# Patient Record
Sex: Male | Born: 1970
Health system: Southern US, Community
[De-identification: ages and names within clinical notes are randomized; demographics above are authoritative.]

## PROBLEM LIST (undated history)

## (undated) DIAGNOSIS — M109 Gout, unspecified: Secondary | ICD-10-CM

## (undated) DIAGNOSIS — I10 Essential (primary) hypertension: Secondary | ICD-10-CM

## (undated) DIAGNOSIS — I4891 Unspecified atrial fibrillation: Secondary | ICD-10-CM

## (undated) DIAGNOSIS — J45909 Unspecified asthma, uncomplicated: Secondary | ICD-10-CM

---

## 2008-09-05 ENCOUNTER — Emergency Department (HOSPITAL_COMMUNITY): Admission: EM | Admit: 2008-09-05 | Discharge: 2008-09-06 | Payer: Self-pay | Admitting: Emergency Medicine

## 2010-09-14 DIAGNOSIS — R0602 Shortness of breath: Secondary | ICD-10-CM

## 2011-02-21 ENCOUNTER — Inpatient Hospital Stay (INDEPENDENT_AMBULATORY_CARE_PROVIDER_SITE_OTHER)
Admission: RE | Admit: 2011-02-21 | Discharge: 2011-02-21 | Disposition: A | Discharge status: Home / Self Care | Payer: BC Managed Care – PPO | Source: Ambulatory Visit | Attending: Family Medicine | Admitting: Family Medicine

## 2011-02-21 ENCOUNTER — Ambulatory Visit (INDEPENDENT_AMBULATORY_CARE_PROVIDER_SITE_OTHER): Payer: BC Managed Care – PPO

## 2011-02-21 DIAGNOSIS — M79609 Pain in unspecified limb: Secondary | ICD-10-CM

## 2011-02-21 LAB — URIC ACID: Uric Acid, Serum: 11.6 mg/dL — ABNORMAL HIGH (ref 4.0–7.8)

## 2011-09-27 ENCOUNTER — Encounter (HOSPITAL_COMMUNITY): Payer: Self-pay | Admitting: *Deleted

## 2011-09-27 ENCOUNTER — Emergency Department (HOSPITAL_COMMUNITY): Payer: 59

## 2011-09-27 ENCOUNTER — Emergency Department (HOSPITAL_COMMUNITY)
Admission: EM | Admit: 2011-09-27 | Discharge: 2011-09-27 | Disposition: A | Discharge status: Home / Self Care | Payer: 59 | Attending: Emergency Medicine | Admitting: Emergency Medicine

## 2011-09-27 ENCOUNTER — Encounter (HOSPITAL_COMMUNITY): Payer: Self-pay | Admitting: Emergency Medicine

## 2011-09-27 ENCOUNTER — Emergency Department (HOSPITAL_COMMUNITY)
Admission: EM | Admit: 2011-09-27 | Discharge: 2011-09-27 | Disposition: A | Discharge status: Nursing Home (Includes ICF) | Payer: Managed Care, Other (non HMO) | Source: Home / Self Care | Attending: Emergency Medicine | Admitting: Emergency Medicine

## 2011-09-27 DIAGNOSIS — H11429 Conjunctival edema, unspecified eye: Secondary | ICD-10-CM | POA: Insufficient documentation

## 2011-09-27 DIAGNOSIS — H5710 Ocular pain, unspecified eye: Secondary | ICD-10-CM | POA: Insufficient documentation

## 2011-09-27 DIAGNOSIS — H00039 Abscess of eyelid unspecified eye, unspecified eyelid: Secondary | ICD-10-CM | POA: Insufficient documentation

## 2011-09-27 DIAGNOSIS — L03213 Periorbital cellulitis: Secondary | ICD-10-CM

## 2011-09-27 DIAGNOSIS — H05019 Cellulitis of unspecified orbit: Secondary | ICD-10-CM

## 2011-09-27 HISTORY — DX: Essential (primary) hypertension: I10

## 2011-09-27 LAB — DIFFERENTIAL
Basophils Absolute: 0.1 K/uL (ref 0.0–0.1)
Basophils Relative: 1 % (ref 0–1)
Eosinophils Absolute: 0.7 10*3/uL (ref 0.0–0.7)
Eosinophils Relative: 7 % — ABNORMAL HIGH (ref 0–5)
Lymphocytes Relative: 19 % (ref 12–46)
Lymphs Abs: 2 10*3/uL (ref 0.7–4.0)
Monocytes Absolute: 0.9 10*3/uL (ref 0.1–1.0)
Monocytes Relative: 9 % (ref 3–12)
Neutro Abs: 6.5 K/uL (ref 1.7–7.7)
Neutrophils Relative %: 64 % (ref 43–77)

## 2011-09-27 LAB — CBC
HCT: 42.9 % (ref 39.0–52.0)
Hemoglobin: 14.4 g/dL (ref 13.0–17.0)
MCH: 26.4 pg (ref 26.0–34.0)
MCHC: 33.6 g/dL (ref 30.0–36.0)
MCV: 78.7 fL (ref 78.0–100.0)
Platelets: 224 K/uL (ref 150–400)
RBC: 5.45 MIL/uL (ref 4.22–5.81)
RDW: 13.2 % (ref 11.5–15.5)
WBC: 10.3 10*3/uL (ref 4.0–10.5)

## 2011-09-27 LAB — BASIC METABOLIC PANEL
BUN: 20 mg/dL (ref 6–23)
CO2: 25 mEq/L (ref 19–32)
Calcium: 10.4 mg/dL (ref 8.4–10.5)
Creatinine, Ser: 0.9 mg/dL (ref 0.50–1.35)
Glucose, Bld: 107 mg/dL — ABNORMAL HIGH (ref 70–99)
Sodium: 133 mEq/L — ABNORMAL LOW (ref 135–145)

## 2011-09-27 LAB — BASIC METABOLIC PANEL WITH GFR
Chloride: 97 meq/L (ref 96–112)
GFR calc Af Amer: >90 mL/min (ref >90)
GFR calc non Af Amer: >90 mL/min (ref >90)
Potassium: 4 meq/L (ref 3.5–5.1)

## 2011-09-27 MED ORDER — SULFAMETHOXAZOLE-TRIMETHOPRIM 800-160 MG PO TABS: 1.0000 | ORAL_TABLET | Freq: Two times a day (BID) | ORAL | Status: AC

## 2011-09-27 MED ORDER — FLUORESCEIN SODIUM 1 MG OP STRP
1.0000 | ORAL_STRIP | Freq: Once | OPHTHALMIC | Status: AC
  Administered 2011-09-27: 1 via OPHTHALMIC
  Filled 2011-09-27: qty 1

## 2011-09-27 MED ORDER — FAMOTIDINE 20 MG PO TABS
ORAL_TABLET | ORAL | Status: AC
  Administered 2011-09-27: 20 mg via ORAL
  Filled 2011-09-27: qty 1

## 2011-09-27 MED ORDER — TETRACAINE HCL 0.5 % OP SOLN
1.0000 [drp] | Freq: Once | OPHTHALMIC | Status: AC
  Administered 2011-09-27: 1 [drp] via OPHTHALMIC
  Filled 2011-09-27: qty 2

## 2011-09-27 MED ORDER — IOHEXOL 300 MG/ML  SOLN
80.0000 mL | Freq: Once | INTRAMUSCULAR | Status: AC | PRN
  Administered 2011-09-27: 80 mL via INTRAVENOUS

## 2011-09-27 MED ORDER — CEPHALEXIN 500 MG PO CAPS: 500.0000 mg | ORAL_CAPSULE | Freq: Four times a day (QID) | ORAL | Status: AC

## 2011-09-27 MED ORDER — SODIUM CHLORIDE 0.9 % IV SOLN
Freq: Once | INTRAVENOUS | Status: AC
  Administered 2011-09-27: 13:00:00 via INTRAVENOUS

## 2011-09-27 MED ORDER — DIPHENHYDRAMINE HCL 50 MG/ML IJ SOLN
INTRAMUSCULAR | Status: AC
  Administered 2011-09-27: 25 mg via INTRAVENOUS
  Filled 2011-09-27: qty 1

## 2011-09-27 NOTE — Discharge Instructions (Signed)
We have determined that your problem requires further evaluation in the emergency department.  We will take care of your transport there.  Once at the emergency department, you will be evaluated by a provider and they will order whatever treatment or tests they deem necessary.  We cannot guarantee that they will do any specific test or do any specific treatment.    Orbital Cellulitis  Orbital cellulitis is an infection of the soft tissue of the orbit without abscess formation. The eye socket is the area around and behind the eye. It usually comes on suddenly in children, but can happen at any age. This condition can lead to death if untreated.  CAUSES   A germ (bacterial) infection of the area around and behind the eye.   Long-term (chronic) sinus infections.   An object (foreign body) stuck behind the eye.   An injury that goes through (penetrates) to tissues of the eyelids.   Trauma with secondary infection.   Fracture of the boney wall or floor of the orbit.   Serious eyelid infections.   Bite wounds.   Inflammation or infection of the lining membranes of the brain (meningitis).   Blood poisoning or infection (septicemia).   Dental area filled with pus (abscesses).   Severe uncontrolled diabetes (ketoacidosis).  SYMPTOMS   Pain in the eye.   Redness and puffiness (swelling) of the eyelids. The swelling is often bad enough that the eye has to close.   Fever and feeling generally ill.   The lids and the cheek may be very red, hot and swollen.   A drop in vision.   Pain when touching the area around the eye.   The eye itself may look like it is "popping out" (proptosis).   Double vision - seeing two of everything (diplopia).  DIAGNOSIS  An ophthalmologist can tell you if you have orbital cellulitis during an eye exam. It is important to know if the infection goes into the area behind the eye. True orbital cellulitis is a medical emergency. A CT scan may be needed to  see if the sinuses are involved. A CT scan can also see if an abscess has formed behind the eye. TREATMENT  Orbital cellulitis should be treated in the hospital with medicine that kills germs (antibiotics). These antibiotics are given right into the bloodstream through a vein (intravenous). SEEK IMMEDIATE MEDICAL CARE IF:   You have red, swollen eyelids.   You have a fever.   You develop double vision.  MAKE SURE YOU:   Understand these instructions.   Will watch your condition.   Will get help right away if you are not doing well or get worse.  Document Released: 05/02/2001 Document Revised: 04/27/2011 Document Reviewed: 09/02/2007 [facility] Patient Information 2012 [place], [place].

## 2011-09-27 NOTE — ED Provider Notes (Signed)
History   This chart was scribed for Billy Beard. Billy Lamas, MD by Clarita Crane. The patient was seen in room CDU07/CDU07. Patient's care was started at 1056.    CSN: [id number]  Arrival date & time 09/27/11  1056   First MD Initiated Contact with Patient 09/27/11 1209      Chief Complaint  Patient presents with  . Eye Problem    (Consider location/radiation/quality/duration/timing/severity/associated sxs/prior treatment) HPI Billy Beard is a 41 y.o. male who presents to the Emergency Department after referral from Urgent Care complaining of constant moderate right eye swelling onset 2 days ago and gradually worsening since. States he was experiencing pruritus to right eye prior to onset of swelling and recalls scratching his eye. States right eye pain is worse with movement particularly to medial aspect of eye.  Denies blurred vision, visual disturbance, nausea, vomiting, trauma/injury and history of similar symptoms. Patient reports having h/o seasonal allergies.  Past Medical History  Diagnosis Date  . Hypertension     History reviewed. No pertinent past surgical history.  History reviewed. No pertinent family history.  History  Substance Use Topics  . Smoking status: Never Smoker   . Smokeless tobacco: Not on file  . Alcohol Use: No      Review of Systems  Constitutional: Negative for fever and chills.  Eyes: Positive for pain and itching. Negative for visual disturbance.       Swelling of right eye and eyelid.   Respiratory: Negative for shortness of breath.   Gastrointestinal: Negative for nausea and vomiting.  Neurological: Negative for weakness.    Allergies  Review of patient's allergies indicates no known allergies.  Home Medications   Current Outpatient Rx  Name Route Sig Dispense Refill  . HYDROCHLOROTHIAZIDE 12.5 MG PO CAPS Oral Take 12.5 mg by mouth daily.    Marland Kitchen LISINOPRIL 10 MG PO TABS Oral Take 10 mg by mouth daily.    . CEPHALEXIN 500 MG PO CAPS Oral  Take 1 capsule (500 mg total) by mouth 4 (four) times daily. 28 capsule 0  . SULFAMETHOXAZOLE-TRIMETHOPRIM 800-160 MG PO TABS Oral Take 1 tablet by mouth every 12 (twelve) hours. 14 tablet 0    BP 139/83  Pulse 76  Temp(Src) 97.8 F (36.6 C) (Oral)  Resp 16  SpO2 98%  Physical Exam  Nursing note and vitals reviewed. Constitutional: He is oriented to person, place, and time. He appears well-developed and well-nourished. No distress.  HENT:  Head: Normocephalic and atraumatic.  Mouth/Throat: Oropharynx is clear and moist.       Tenderness over right maxillary region.  Eyes: EOM are normal. Pupils are equal, round, and reactive to light. Right eye exhibits no discharge. Left eye exhibits no discharge.       Chemosis of bilateral eyes. Bilateral Eyelids edematous and right eyelid mildly erythematous. Patient reports pain to medial aspect of eye with movement of right eye.   Neck: Neck supple. No tracheal deviation present.  Cardiovascular: Normal rate.   Pulmonary/Chest: Effort normal. No respiratory distress.  Musculoskeletal: Normal range of motion.  Neurological: He is alert and oriented to person, place, and time.  Skin: Skin is warm and dry.  Psychiatric: He has a normal mood and affect. His behavior is normal.    ED Course  Procedures (including critical care time)  DIAGNOSTIC STUDIES: Oxygen Saturation is 98% on room air, normal by my interpretation.    COORDINATION OF CARE: 12:13PM-Patient informed of current plan for treatment and evaluation and agrees  with plan at this time. Will perform more detailed eye exam.     Labs Reviewed  DIFFERENTIAL - Abnormal; Notable for the following:    Eosinophils Relative 7 (*)    All other components within normal limits  BASIC METABOLIC PANEL - Abnormal; Notable for the following:    Sodium 133 (*)    Glucose, Bld 107 (*)    All other components within normal limits  CBC  LAB REPORT - SCANNED   Ct Orbits W/cm  09/27/2011   *RADIOLOGY REPORT*  Clinical Data: Swelling apparatus of the right eye.  Pain with movement.  CT ORBITS WITH CONTRAST  Technique:  Multidetector CT imaging of the orbits was performed following the bolus administration of intravenous contrast.  Contrast: 80mL OMNIPAQUE IOHEXOL 300 MG/ML  SOLN  Comparison: None.  Findings: Soft tissue swelling enhancement is noted about the right orbit.  There is stranding of fat preseptal fat.  The postseptal fat is intact.  The globes are intact.  The rectus muscles are normal.  The optic nerves are symmetric.  No discrete fluid collection is present within the area of inflammation.  Limited imaging of the brain is normal.  The visualized portions of the face are unremarkable.  No osseous destruction is evident.  IMPRESSION:  1.  No right periorbital soft tissue swelling likely represents cellulitis. 2.  No post-septal invasion or evidence for abscess.  Original Report Authenticated By: Jamesetta Orleans. MATTERN, M.D.   I reviewed the above film myself.  1. Periorbital cellulitis       MDM  I personally performed the services described in this documentation, which was scribed in my presence. The recorded information has been reviewed and considered.    Pt with some clinical concern for possible orbital cellulitis with erythema, pain, no fever, but pain medially with EOM.  Will place in CDU holding, obtain flurosceine staining, tonometer reading and CT of orbits.  Discussed with PAC Narvaez.  IVF's, CBC and BMP ordered.     Billy Beard. Billy Lamas, MD 09/29/11 [phone]

## 2011-09-27 NOTE — ED Provider Notes (Signed)
CT showing preseptal soft tissue swelling only c/w cellulitis. Will treat with abx and discharge home. Fluoroscein negative for uptake.   Rodena Medin, PA-C 09/27/11 1519

## 2011-09-27 NOTE — ED Notes (Signed)
Pt sent here from ucc for two days of swelling, redness, pain to right eye. No change in vision. Sent here to r/o orbital cellulitis.

## 2011-09-27 NOTE — ED Provider Notes (Signed)
Chief Complaint  Patient presents with  . Facial Swelling  . Conjunctivitis    History of Present Illness:   The patient is a 41 year old male who has had a two-day history of redness, swelling, and pain in the right upper and lower eyelids. He denies any diplopia, but does have some pain on medial gaze. He states his vision is normal. There's been some discharge from the eye. He's felt chilled but not had any fever. He's had some nasal congestion and rhinorrhea. He denies sore throat, swollen glands, or cough.  Review of Systems:  Other than noted above, the patient denies any of the following symptoms: Systemic:  No fever, chills, sweats, fatigue, or weight loss. Eye:  No redness, eye pain, photophobia, discharge, blurred vision, or diplopia. ENT:  No nasal congestion, rhinorrhea, or sore throat. Lymphatic:  No adenopathy. Skin:  No rash or pruritis.  PMFSH:  Past medical history, family history, social history, meds, and allergies were reviewed.  Physical Exam:   Vital signs:  BP 119/72  Pulse 52  Temp(Src) 97.7 F (36.5 C) (Oral)  Resp 16  SpO2 100% General:  Alert and in no distress. Eye:  There was swelling, redness, heat, and tenderness of his upper and lower eyelids, extending to the periorbital area. There is no pain over the frontal or maxillary sinuses. He has no skin rash. The eye itself appears normal with the exception of inflammation of the palpebral conjunctiva. There is no discharge, cornea is intact, anterior chambers normal, and fundus is normal. PERRLA, full EOMs with some pain on medial gaze. ENT:  TMs and canals clear.  Nasal mucosa normal.  No intra-oral lesions, mucous membranes moist, pharynx clear. Neck:  No adenopathy tenderness or mass. Skin:  Clear, warm and dry.  Assessment:  The encounter diagnosis was Orbital cellulitis.  Plan:   1.  The following meds were prescribed:   New Prescriptions   No medications on file   2.  The patient was transferred  to the emergency department via shuttle for further evaluation.  Reuben Likes, MD 09/27/11 1054

## 2011-09-27 NOTE — ED Notes (Signed)
PT HERE WITH RIGHT UPPER LID SWELLING,ITCHY WATERY EYE THAT STARTED X 2 DYS AGO.LANGUAGE BARRIER BUT ABLE TO COMMUNICATE NEED.NO H/A,BLURRY VISION OR DIZZINESS NOTED

## 2011-09-27 NOTE — Discharge Instructions (Signed)
YOU CAN BE DISCHARGED HOME TO FOLLOW UP WITH YOUR DOCTOR FOR RECHECK IN 2 DAYS - RETURN HERE IF YOU HAVE NO REGULAR DOCTOR. TAKE MEDICATIONS AS PRESCRIBED. IBUPROFEN FOR DISCOMFORT.   Periorbital Cellulitis Periorbital cellulitis is a common infection that can affect the eyelid and the soft tissues that surround the eyeball. The infection may also affect the structures that produce and drain tears. It does not affect the eyeball itself. Natural tissue barriers usually prevent the spread of this infection to the eyeball and other deeper areas of the eye socket.  CAUSES  Bacterial infection.   Long-term (chronic) sinus infections.   An object (foreign body) stuck behind the eye.   An injury that goes through the eyelid tissues.   An injury that causes an infection, such as an insect sting.   Fracture of the bone around the eye.   Infections which have spread from the eyelid or other structures around the eye.   Bite wounds.   Inflammation or infection of the lining membranes of the brain (meningitis).   An infection in the blood (septicemia).   Dental infection (abscess).   Viral infection (this is rare).  SYMPTOMS Symptoms usually come on suddenly.  Pain in the eye.   Red, hot, and swollen eyelids and possibly cheeks. The swelling is sometimes bad enough that the eyelids cannot open. Some infections make the eyelids look purple.   Fever and feeling generally ill.   Pain when touching the area around the eye.  DIAGNOSIS  Periorbital cellulitis can be diagnosed from an eye exam. In severe cases, your caregiver might suggest:  Blood tests.   Imaging tests (such as a CT scan) to examine the sinuses and the area around and behind the eyeball.  TREATMENT If your caregiver feels that you do not have any signs of serious infection, treatment may include:  Antibiotics.   Nasal decongestants to reduce swelling.   Referral to a dentist if it is suspected that the infection  was caused by a prior tooth infection.   Examination every day to make sure the problem is improving.  HOME CARE INSTRUCTIONS  Take your antibiotics as directed. Finish them even if you start to feel better.   Some pain is normal with this condition. Take pain medicine as directed by your caregiver. Only take pain medicines approved by your caregiver.   It is important to drink fluids. Drink enough water and fluids to keep your urine clear or pale yellow.   Do not smoke.   Rest and get plenty of sleep.   Mild or moderate fevers generally have no long-term effects and often do not require treatment.   If your caregiver has given you a follow-up appointment, it is very important to keep that appointment. Your caregiver will need to make sure that the infection is getting better. It is important to check that a more serious infection is not developing.  SEEK IMMEDIATE MEDICAL CARE IF:  Your eyelids become more painful, red, warm, or swollen.   You develop double vision or your vision becomes blurred or worsens in any way.   You have trouble moving your eyes.   The eye looks like it is popping out (proptosis).   You develop a severe headache, severe neck pain, or neck stiffness.   You develop repeated vomiting.   You have a fever or persistent symptoms for more than 72 hours.   You have a fever and your symptoms suddenly get worse.  MAKE SURE  YOU:  Understand these instructions.   Will watch your condition.   Will get help right away if you are not doing well or get worse.  Document Released: 06/10/2010 Document Revised: 04/27/2011 Document Reviewed: 06/10/2010 [facility] Patient Information 2012 [place], [place].

## 2011-09-29 NOTE — ED Provider Notes (Signed)
Medical screening examination/treatment/procedure(s) were conducted as a shared visit with non-physician practitioner(s) and myself.  I personally evaluated the patient during the encounter   Please see my original note.  PA assumed care during stay in the CDU.  I reviewed all results and agree with disposition.    Gavin Pound. Oletta Lamas, MD 09/29/11 [phone]

## 2012-09-11 ENCOUNTER — Encounter (HOSPITAL_COMMUNITY): Payer: Self-pay

## 2012-09-11 ENCOUNTER — Emergency Department (INDEPENDENT_AMBULATORY_CARE_PROVIDER_SITE_OTHER)
Admission: EM | Admit: 2012-09-11 | Discharge: 2012-09-11 | Disposition: A | Discharge status: Home / Self Care | Payer: 59 | Source: Home / Self Care | Attending: Family Medicine | Admitting: Family Medicine

## 2012-09-11 DIAGNOSIS — I10 Essential (primary) hypertension: Secondary | ICD-10-CM

## 2012-09-11 DIAGNOSIS — H00019 Hordeolum externum unspecified eye, unspecified eyelid: Secondary | ICD-10-CM

## 2012-09-11 DIAGNOSIS — H00013 Hordeolum externum right eye, unspecified eyelid: Secondary | ICD-10-CM

## 2012-09-11 LAB — POCT I-STAT, CHEM 8
Calcium, Ion: 1.27 mmol/L — ABNORMAL HIGH (ref 1.12–1.23)
Creatinine, Ser: 0.8 mg/dL (ref 0.50–1.35)
Glucose, Bld: 118 mg/dL — ABNORMAL HIGH (ref 70–99)
Hemoglobin: 15 g/dL (ref 13.0–17.0)
Potassium: 3.9 mEq/L (ref 3.5–5.1)

## 2012-09-11 MED ORDER — LISINOPRIL 20 MG PO TABS: 20.0000 mg | ORAL_TABLET | Freq: Every day | ORAL | Status: DC

## 2012-09-11 MED ORDER — TOBRAMYCIN 0.3 % OP SOLN: 1.0000 [drp] | Freq: Four times a day (QID) | OPHTHALMIC | Status: DC

## 2012-09-11 MED ORDER — CLONIDINE HCL 0.1 MG PO TABS
ORAL_TABLET | ORAL | Status: AC
  Filled 2012-09-11: qty 1

## 2012-09-11 MED ORDER — FUROSEMIDE 40 MG PO TABS
40.0000 mg | ORAL_TABLET | Freq: Every day | ORAL | Status: DC
  Administered 2012-09-11: 40 mg via ORAL

## 2012-09-11 MED ORDER — FUROSEMIDE 40 MG PO TABS
ORAL_TABLET | ORAL | Status: AC
  Filled 2012-09-11: qty 1

## 2012-09-11 MED ORDER — HYDROCHLOROTHIAZIDE 25 MG PO TABS: 25.0000 mg | ORAL_TABLET | Freq: Every day | ORAL | Status: DC

## 2012-09-11 MED ORDER — CLONIDINE HCL 0.1 MG PO TABS
0.1000 mg | ORAL_TABLET | Freq: Every day | ORAL | Status: DC
  Administered 2012-09-11: 0.1 mg via ORAL

## 2012-09-11 MED ORDER — LABETALOL HCL 100 MG PO TABS
100.0000 mg | ORAL_TABLET | Freq: Two times a day (BID) | ORAL | Status: DC
Start: 2012-09-11 — End: 2014-11-14

## 2012-09-11 NOTE — ED Notes (Signed)
States he ran out of his BP medication a couple of days ago; has had right eye lid swelling x 2 days

## 2012-09-11 NOTE — ED Provider Notes (Signed)
History     CSN: [id number]  Arrival date & time 09/11/12  1412   First MD Initiated Contact with Patient 09/11/12 1451      Chief Complaint  Patient presents with  . Hypertension    (Consider location/radiation/quality/duration/timing/severity/associated sxs/prior treatment) Patient is a 42 y.o. male presenting with hypertension. The history is provided by the patient.  Hypertension This is a chronic problem. Episode onset: has been out of meds for approx 1 mo., started with headache this am. The problem has been gradually worsening. Associated symptoms include headaches. Pertinent negatives include no chest pain and no abdominal pain.    Past Medical History  Diagnosis Date  . Hypertension     History reviewed. No pertinent past surgical history.  History reviewed. No pertinent family history.  History  Substance Use Topics  . Smoking status: Never Smoker   . Smokeless tobacco: Not on file  . Alcohol Use: No      Review of Systems  Constitutional: Negative.   Eyes: Positive for redness. Negative for photophobia, pain, discharge, itching and visual disturbance.  Respiratory: Negative.   Cardiovascular: Negative.  Negative for chest pain and leg swelling.  Gastrointestinal: Negative for abdominal pain.  Neurological: Positive for headaches. Negative for dizziness, light-headedness and numbness.    Allergies  Review of patient's allergies indicates no known allergies.  Home Medications   Current Outpatient Rx  Name  Route  Sig  Dispense  Refill  . hydrochlorothiazide (HYDRODIURIL) 25 MG tablet   Oral   Take 1 tablet (25 mg total) by mouth daily.   30 tablet   1   . hydrochlorothiazide (MICROZIDE) 12.5 MG capsule   Oral   Take 12.5 mg by mouth daily.         Marland Kitchen labetalol (NORMODYNE) 100 MG tablet   Oral   Take 1 tablet (100 mg total) by mouth 2 (two) times daily.   60 tablet   1   . lisinopril (PRINIVIL,ZESTRIL) 10 MG tablet   Oral   Take 10 mg  by mouth daily.         Marland Kitchen lisinopril (PRINIVIL,ZESTRIL) 20 MG tablet   Oral   Take 1 tablet (20 mg total) by mouth daily.   30 tablet   1   . tobramycin (TOBREX) 0.3 % ophthalmic solution   Right Eye   Place 1 drop into the right eye every 6 (six) hours.   5 mL   0     BP 177/100  Pulse 68  Temp(Src) 98.3 F (36.8 C) (Oral)  Resp 16  SpO2 98%  Physical Exam  Nursing note and vitals reviewed. Constitutional: He is oriented to person, place, and time. He appears well-developed and well-nourished.  HENT:  Head: Normocephalic.  Right Ear: External ear normal.  Left Ear: External ear normal.  Mouth/Throat: Oropharynx is clear and moist.  Eyes: Conjunctivae and EOM are normal. Pupils are equal, round, and reactive to light.    Neck: Normal range of motion. Neck supple.  Cardiovascular: Normal rate, regular rhythm and normal heart sounds.   Pulmonary/Chest: Effort normal and breath sounds normal.  Lymphadenopathy:    He has no cervical adenopathy.  Neurological: He is alert and oriented to person, place, and time.  Skin: Skin is warm and dry.    ED Course  Procedures (including critical care time)  Labs Reviewed  POCT I-STAT, CHEM 8 - Abnormal; Notable for the following:    Glucose, Bld 118 (*)    Calcium,  Ion 1.27 (*)    All other components within normal limits   No results found.   1. Hypertension   2. Stye, right       MDM          Linna Hoff, MD 09/11/12 [phone]

## 2012-10-27 ENCOUNTER — Emergency Department (HOSPITAL_BASED_OUTPATIENT_CLINIC_OR_DEPARTMENT_OTHER): Payer: 59

## 2012-10-27 ENCOUNTER — Emergency Department (HOSPITAL_BASED_OUTPATIENT_CLINIC_OR_DEPARTMENT_OTHER)
Admission: EM | Admit: 2012-10-27 | Discharge: 2012-10-27 | Disposition: A | Discharge status: Home / Self Care | Payer: 59 | Attending: Emergency Medicine | Admitting: Emergency Medicine

## 2012-10-27 ENCOUNTER — Encounter (HOSPITAL_BASED_OUTPATIENT_CLINIC_OR_DEPARTMENT_OTHER): Payer: Self-pay

## 2012-10-27 DIAGNOSIS — Z79899 Other long term (current) drug therapy: Secondary | ICD-10-CM | POA: Insufficient documentation

## 2012-10-27 DIAGNOSIS — M545 Low back pain, unspecified: Secondary | ICD-10-CM | POA: Insufficient documentation

## 2012-10-27 DIAGNOSIS — I10 Essential (primary) hypertension: Secondary | ICD-10-CM | POA: Insufficient documentation

## 2012-10-27 DIAGNOSIS — M549 Dorsalgia, unspecified: Secondary | ICD-10-CM

## 2012-10-27 MED ORDER — HYDROMORPHONE HCL PF 1 MG/ML IJ SOLN
1.0000 mg | Freq: Once | INTRAMUSCULAR | Status: AC
  Administered 2012-10-27: 1 mg via INTRAMUSCULAR
  Filled 2012-10-27: qty 1

## 2012-10-27 MED ORDER — KETOROLAC TROMETHAMINE 60 MG/2ML IM SOLN
60.0000 mg | Freq: Once | INTRAMUSCULAR | Status: AC
  Administered 2012-10-27: 60 mg via INTRAMUSCULAR
  Filled 2012-10-27: qty 2

## 2012-10-27 MED ORDER — PROMETHAZINE HCL 25 MG/ML IJ SOLN
25.0000 mg | Freq: Once | INTRAMUSCULAR | Status: AC
  Administered 2012-10-27: 25 mg via INTRAMUSCULAR
  Filled 2012-10-27: qty 1

## 2012-10-27 MED ORDER — OXYCODONE-ACETAMINOPHEN 5-325 MG PO TABS: 1.0000 | ORAL_TABLET | ORAL | Status: DC | PRN

## 2012-10-27 NOTE — ED Notes (Signed)
Patient here with lower back pain x 1 day. Reports that he feels stiff and has increased pain with change in position. Denies injury

## 2012-10-27 NOTE — ED Provider Notes (Signed)
History  This chart was scribed for Gilda Crease, MD by Ardelia Mems, ED Scribe. This patient was seen in room MH10/MH10 and the patient's care was started at 9:14 PM.   CSN: [id number]  Arrival date & time 10/27/12  1948     Chief Complaint  Patient presents with  . Back Pain    The history is provided by the patient. No language interpreter was used.    HPI Comments: Billy Beard is a 42 y.o. male who presents to the Emergency Department complaining of sudden onset, gradually worsening severe middle lumbar back pain onset yesterday. Pt states that he woke up with the pain. Pt reports that he feels stiff and states that his pain is worsened with sitting and positioning. Pt states that he has had no previous h/o back pain. Pt denies recent injury or falls causing the current pain. Pt denies urinary symptoms, vomiting, fever, diarrhea or any other symptoms.  Past Medical History  Diagnosis Date  . Hypertension     History reviewed. No pertinent past surgical history.  No family history on file.  History  Substance Use Topics  . Smoking status: Never Smoker   . Smokeless tobacco: Not on file  . Alcohol Use: No      Review of Systems  Constitutional: Negative for fever and chills.  HENT: Negative for neck pain.   Cardiovascular: Negative for chest pain.  Gastrointestinal: Negative for nausea, vomiting, abdominal pain and diarrhea.  Genitourinary: Negative for dysuria.  Musculoskeletal: Positive for back pain.   A complete 10 system review of systems was obtained and all systems are negative except as noted in the HPI and PMH.   Allergies  Review of patient's allergies indicates no known allergies.  Home Medications   Current Outpatient Rx  Name  Route  Sig  Dispense  Refill  . hydrochlorothiazide (HYDRODIURIL) 25 MG tablet   Oral   Take 1 tablet (25 mg total) by mouth daily.   30 tablet   1   . hydrochlorothiazide (MICROZIDE) 12.5 MG capsule    Oral   Take 12.5 mg by mouth daily.         Marland Kitchen labetalol (NORMODYNE) 100 MG tablet   Oral   Take 1 tablet (100 mg total) by mouth 2 (two) times daily.   60 tablet   1   . lisinopril (PRINIVIL,ZESTRIL) 10 MG tablet   Oral   Take 10 mg by mouth daily.         Marland Kitchen lisinopril (PRINIVIL,ZESTRIL) 20 MG tablet   Oral   Take 1 tablet (20 mg total) by mouth daily.   30 tablet   1   . tobramycin (TOBREX) 0.3 % ophthalmic solution   Right Eye   Place 1 drop into the right eye every 6 (six) hours.   5 mL   0     Triage Vitals: BP 144/96  Pulse 76  Temp(Src) 99.2 F (37.3 C) (Oral)  Resp 16  Wt 170 lb (77.111 kg)  SpO2 98%  Physical Exam  Constitutional: He is oriented to person, place, and time. He appears well-developed and well-nourished. No distress.  HENT:  Head: Normocephalic and atraumatic.  Right Ear: Hearing normal.  Left Ear: Hearing normal.  Nose: Nose normal.  Mouth/Throat: Oropharynx is clear and moist and mucous membranes are normal.  Eyes: Conjunctivae and EOM are normal. Pupils are equal, round, and reactive to light.  Neck: Normal range of motion. Neck supple.  Cardiovascular: Regular rhythm,  S1 normal and S2 normal.  Exam reveals no gallop and no friction rub.   No murmur heard. Pulmonary/Chest: Effort normal and breath sounds normal. No respiratory distress. He exhibits no tenderness.  Abdominal: Soft. Normal appearance and bowel sounds are normal. There is no hepatosplenomegaly. There is no tenderness. There is no rebound, no guarding, no tenderness at McBurney's point and negative Murphy's sign. No hernia.  Musculoskeletal: Normal range of motion.  Neurological: He is alert and oriented to person, place, and time. He has normal strength. No cranial nerve deficit or sensory deficit. Coordination normal. GCS eye subscore is 4. GCS verbal subscore is 5. GCS motor subscore is 6.  Skin: Skin is warm, dry and intact. No rash noted. No cyanosis.  Psychiatric: He  has a normal mood and affect. His speech is normal and behavior is normal. Thought content normal.    ED Course  Procedures (including critical care time)  DIAGNOSTIC STUDIES: Oxygen Saturation is 98% on RA, normal by my interpretation.    COORDINATION OF CARE: 9:17 PM- Pt advised of plan for treatment and pt agrees.  Medications  HYDROmorphone (DILAUDID) injection 1 mg (1 mg Intramuscular Given 10/27/12 2157)  promethazine (PHENERGAN) injection 25 mg (25 mg Intramuscular Given 10/27/12 2156)  ketorolac (TORADOL) injection 60 mg (60 mg Intramuscular Given 10/27/12 2155)      Labs Reviewed - No data to display Dg Lumbar Spine Complete  10/27/2012   *RADIOLOGY REPORT*  Clinical Data: Left-sided low back pain for 1 day.  No injury.  LUMBAR SPINE - COMPLETE 4+ VIEW  Comparison: None.  Findings: Five lumbar type vertebrae.  Normal alignment of the lumbar vertebrae and facet joints.  No vertebral compression deformities.  Mild degenerative changes with slight narrowing of the intervertebral disc spaces and endplate hypertrophic changes throughout.  No focal bone lesion or bone destruction.  Bone cortex and trabecular architecture appear intact.  IMPRESSION: Mild degenerative changes.  No displaced fractures identified.   Original Report Authenticated By: Burman Nieves, M.D.     Diagnosis: Severe lumbar pain    MDM  Patient presents to ER with complaints of severe pain in the lower back region for one day. Patient denies injury. Pain is in the left paraspinal region and is consistent with acute muscle spasm. He does not have a radicular component. Kidney stone is not considered because patient has severe pain with any movement. Pain is very reproducible with minimal dorsal movements as well as palpation of the area. He has not had any urinary symptoms.  Present medication and ER and is feeling much better. Will be prescribed outpatient analgesia.   I personally performed the services  described in this documentation, which was scribed in my presence. The recorded information has been reviewed and is accurate.    Gilda Crease, MD 10/27/12 2312

## 2012-10-27 NOTE — ED Notes (Signed)
Pt up in room, pacing and tearful- states he is hurting and wants to leave- explained that pain medicine had been ordered but that he would have to agree to stay in the department in order for it to be given- asked if he had a ride and pt states his brother is driving him home- pt states will stay until he is discharged and his ride is here

## 2012-10-27 NOTE — ED Notes (Signed)
D/c home with family to drive- d/c instructions given to pt's brother Massie Kluver) as pt is sleeping after med administration- rx x 1 given for percocet

## 2012-10-27 NOTE — ED Notes (Signed)
Patient transported to X-ray 

## 2012-10-27 NOTE — ED Notes (Signed)
Patient transported to X-ray and returned 

## 2014-10-03 ENCOUNTER — Emergency Department (INDEPENDENT_AMBULATORY_CARE_PROVIDER_SITE_OTHER)
Admission: EM | Admit: 2014-10-03 | Discharge: 2014-10-03 | Disposition: A | Discharge status: Home / Self Care | Payer: 59 | Source: Home / Self Care | Attending: Emergency Medicine | Admitting: Emergency Medicine

## 2014-10-03 DIAGNOSIS — M654 Radial styloid tenosynovitis [de Quervain]: Secondary | ICD-10-CM

## 2014-10-03 MED ORDER — IBUPROFEN 800 MG PO TABS
800.0000 mg | ORAL_TABLET | Freq: Once | ORAL | Status: AC
  Administered 2014-10-03: 800 mg via ORAL

## 2014-10-03 MED ORDER — HYDROCODONE-ACETAMINOPHEN 5-325 MG PO TABS
ORAL_TABLET | ORAL | Status: AC
  Filled 2014-10-03: qty 1

## 2014-10-03 MED ORDER — HYDROCODONE-ACETAMINOPHEN 5-325 MG PO TABS
1.0000 | ORAL_TABLET | Freq: Once | ORAL | Status: AC
  Administered 2014-10-03: 1 via ORAL

## 2014-10-03 MED ORDER — IBUPROFEN 800 MG PO TABS
ORAL_TABLET | ORAL | Status: AC
  Filled 2014-10-03: qty 1

## 2014-10-03 MED ORDER — IBUPROFEN 800 MG PO TABS: 800.0000 mg | ORAL_TABLET | Freq: Three times a day (TID) | ORAL | Status: DC

## 2014-10-03 MED ORDER — TRAMADOL HCL 50 MG PO TABS: 50.0000 mg | ORAL_TABLET | Freq: Four times a day (QID) | ORAL | Status: DC | PRN

## 2014-10-03 NOTE — ED Notes (Signed)
Right hand pain x last pm. No injury

## 2014-10-03 NOTE — ED Provider Notes (Signed)
CSN: [id number]     Arrival date & time 10/03/14  1104 History   First MD Initiated Contact with Patient 10/03/14 1214     Chief Complaint  Patient presents with  . Hand Pain   (Consider location/radiation/quality/duration/timing/severity/associated sxs/prior Treatment) HPI He is a 44 year old man here for evaluation of right hand pain. He states this started last night and has gradually been getting worse. He denies any injury or trauma. 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 The pain is located at the radial wrist and into the thumb. Pain is worse with some movement. He is able to move his hand. He has not tried any medications.  Past Medical History  Diagnosis Date  . Hypertension    No past surgical history on file. No family history on file. History  Substance Use Topics  . Smoking status: Never Smoker   . Smokeless tobacco: Not on file  . Alcohol Use: No    Review of Systems As in history of present illness Allergies  Review of patient's allergies indicates no known allergies.  Home Medications   Prior to Admission medications   Medication Sig Start Date End Date Taking? Authorizing Provider  hydrochlorothiazide (HYDRODIURIL) 25 MG tablet Take 1 tablet (25 mg total) by mouth daily. 09/11/12   Linna HoffJames D Kindl, MD  hydrochlorothiazide (MICROZIDE) 12.5 MG capsule Take 12.5 mg by mouth daily.    Historical Provider, MD  ibuprofen (ADVIL,MOTRIN) 800 MG tablet Take 1 tablet (800 mg total) by mouth 3 (three) times daily. For 1 week, then as needed 10/03/14   Charm RingsErin J Czarina Gingras, MD  labetalol (NORMODYNE) 100 MG tablet Take 1 tablet (100 mg total) by mouth 2 (two) times daily. 09/11/12   Linna HoffJames D Kindl, MD  lisinopril (PRINIVIL,ZESTRIL) 10 MG tablet Take 10 mg by mouth daily.    Historical Provider, MD  lisinopril (PRINIVIL,ZESTRIL) 20 MG tablet Take 1 tablet (20 mg total) by mouth daily. 09/11/12   Linna HoffJames D Kindl, MD  oxyCODONE-acetaminophen (PERCOCET) 5-325 MG per tablet Take 1  tablet by mouth every 4 (four) hours as needed for pain. 10/27/12   Gilda Creasehristopher J Pollina, MD  tobramycin (TOBREX) 0.3 % ophthalmic solution Place 1 drop into the right eye every 6 (six) hours. 09/11/12   Linna HoffJames D Kindl, MD  traMADol (ULTRAM) 50 MG tablet Take 1 tablet (50 mg total) by mouth every 6 (six) hours as needed. 10/03/14   Charm RingsErin J Maiana Hennigan, MD   BP 192/124 mmHg  Pulse 81  Temp(Src) 97.9 F (36.6 C) (Oral)  Resp 16  SpO2 99% Physical Exam  Constitutional: He is oriented to person, place, and time. He appears well-developed and well-nourished. No distress.  Cardiovascular: Normal rate.   Pulmonary/Chest: Effort normal.  Musculoskeletal:  Right hand: Mild swelling at radial wrist. No erythema. He is able to move all of his digits. 2+ radial pulse. Finkelstein's positive.  Neurological: He is alert and oriented to person, place, and time.    ED Course  Procedures (including critical care time) Labs Review Labs Reviewed - No data to display  Imaging Review No results found.   MDM   1. De Quervain's tenosynovitis    Ibuprofen 800 mg by mouth and Norco 5-325 milligrams by mouth given for pain.  Conservative management with bracing, ice, ibuprofen. Prescription for tramadol given for severe pain. If no improvement in 1 week, follow-up with sports medicine.    Charm RingsErin J Zymiere Trostle, MD 10/03/14 [phone]

## 2014-10-03 NOTE — Discharge Instructions (Signed)
You have inflammation of the tendons in your wrist and thumb. Wear the brace during the day for the next week. Remove it several times a day so you can ice the wrist and do gentle range of motion. Take ibuprofen 800 mg 3 times a day for the next week, then as needed. Use the tramadol every 6 hours as needed for severe pain. You should see improvement in the next week. If things are not improving, please follow-up at sports medicine.

## 2014-10-05 ENCOUNTER — Emergency Department (INDEPENDENT_AMBULATORY_CARE_PROVIDER_SITE_OTHER)
Admission: EM | Admit: 2014-10-05 | Discharge: 2014-10-05 | Disposition: A | Discharge status: Home / Self Care | Payer: 59 | Source: Home / Self Care | Attending: Family Medicine | Admitting: Family Medicine

## 2014-10-05 ENCOUNTER — Emergency Department (INDEPENDENT_AMBULATORY_CARE_PROVIDER_SITE_OTHER): Payer: 59

## 2014-10-05 ENCOUNTER — Encounter (HOSPITAL_COMMUNITY): Payer: Self-pay | Admitting: Emergency Medicine

## 2014-10-05 DIAGNOSIS — M25531 Pain in right wrist: Secondary | ICD-10-CM

## 2014-10-05 LAB — POCT I-STAT, CHEM 8
BUN: 19 mg/dL (ref 6–20)
CHLORIDE: 101 mmol/L (ref 101–111)
Calcium, Ion: 1.38 mmol/L — ABNORMAL HIGH (ref 1.12–1.23)
Creatinine, Ser: 0.9 mg/dL (ref 0.61–1.24)
GLUCOSE: 122 mg/dL — AB (ref 65–99)
HCT: 50 % (ref 39.0–52.0)
Hemoglobin: 17 g/dL (ref 13.0–17.0)
POTASSIUM: 3.7 mmol/L (ref 3.5–5.1)
SODIUM: 140 mmol/L (ref 135–145)
TCO2: 23 mmol/L (ref 0–100)

## 2014-10-05 LAB — URIC ACID: Uric Acid, Serum: 8.4 mg/dL — ABNORMAL HIGH (ref 4.4–7.6)

## 2014-10-05 MED ORDER — PREDNISONE 5 MG (48) PO TBPK: 5.0000 mg | ORAL_TABLET | Freq: Every day | ORAL | Status: DC

## 2014-10-05 MED ORDER — COLCHICINE 0.6 MG PO TABS: 0.6000 mg | ORAL_TABLET | Freq: Every day | ORAL | Status: DC

## 2014-10-05 NOTE — ED Provider Notes (Addendum)
Billy Beard is a 44 y.o. male who presents to Urgent Care today for Right wrist pain present for several days occurring without injury. Patient has right wrist pain and swelling. He was seen in the 14th of thought to have de Quervain's tenosynovitis and treated with NSAIDs and thumb spica splint. His symptoms have not improved. He notes wrist pain. The pain is located at the radial dorsal and ulnar aspect of the wrist. Pain is worse with wrist extension. No radiating pain weakness or numbness. No history of injury.   Past Medical History  Diagnosis Date  . Hypertension    History reviewed. No pertinent past surgical history. History  Substance Use Topics  . Smoking status: Never Smoker   . Smokeless tobacco: Not on file  . Alcohol Use: No   ROS as above Medications: No current facility-administered medications for this encounter.   Current Outpatient Prescriptions  Medication Sig Dispense Refill  . colchicine 0.6 MG tablet Take 1 tablet (0.6 mg total) by mouth daily. 30 tablet 0  . hydrochlorothiazide (HYDRODIURIL) 25 MG tablet Take 1 tablet (25 mg total) by mouth daily. 30 tablet 1  . hydrochlorothiazide (MICROZIDE) 12.5 MG capsule Take 12.5 mg by mouth daily.    Marland Kitchen. ibuprofen (ADVIL,MOTRIN) 800 MG tablet Take 1 tablet (800 mg total) by mouth 3 (three) times daily. For 1 week, then as needed 30 tablet 0  . labetalol (NORMODYNE) 100 MG tablet Take 1 tablet (100 mg total) by mouth 2 (two) times daily. 60 tablet 1  . lisinopril (PRINIVIL,ZESTRIL) 10 MG tablet Take 10 mg by mouth daily.    Marland Kitchen. lisinopril (PRINIVIL,ZESTRIL) 20 MG tablet Take 1 tablet (20 mg total) by mouth daily. 30 tablet 1  . oxyCODONE-acetaminophen (PERCOCET) 5-325 MG per tablet Take 1 tablet by mouth every 4 (four) hours as needed for pain. 20 tablet 0  . predniSONE (STERAPRED UNI-PAK 48 TAB) 5 MG (48) TBPK tablet Take 1 tablet (5 mg total) by mouth daily. dosepack po 48 tablet 0  . tobramycin (TOBREX) 0.3 % ophthalmic  solution Place 1 drop into the right eye every 6 (six) hours. 5 mL 0  . traMADol (ULTRAM) 50 MG tablet Take 1 tablet (50 mg total) by mouth every 6 (six) hours as needed. 15 tablet 0   No Known Allergies   Exam:  BP 151/92 mmHg  Pulse 71  Temp(Src) 98.4 F (36.9 C) (Oral)  Resp 16  SpO2 98% Gen: Well NAD HEENT: EOMI,  MMM Lungs: Normal work of breathing. CTABL Heart: RRR no MRG Abd: NABS, Soft. Nondistended, Nontender Exts: Brisk capillary refill, warm and well perfused.  Right wrist swollen and erythematous on the dorsal aspect of the wrist and quite tender. Patient additionally has some swelling and tenderness at the radial styloid and ulnar stylus. Negative Tinel's. Positive Finkelstein's. Significant pain with wrist flexion and extension Pulses and capillary refill are intact distally  Limited musculoskeletal ultrasound:  First dorsal wrist compartment identified. No significant tenosynovitis on ultrasound. The dorsal wrist was identified. Joint effusions present along the carpal joints. The extensor carpi ulnaris tendon was also identified and found to be normal appearing. Carpal tunnel visualized. Median nerve normal-appearing at 8 mm cross-sectional area.  Results for orders placed or performed during the hospital encounter of 10/05/14 (from the past 24 hour(s))  I-STAT, chem 8     Status: Abnormal   Collection Time: 10/05/14  2:56 PM  Result Value Ref Range   Sodium 140 135 - 145 mmol/L  Potassium 3.7 3.5 - 5.1 mmol/L   Chloride 101 101 - 111 mmol/L   BUN 19 6 - 20 mg/dL   Creatinine, Ser 4.090.90 0.61 - 1.24 mg/dL   Glucose, Bld 811122 (H) 65 - 99 mg/dL   Calcium, Ion 9.141.38 (H) 1.12 - 1.23 mmol/L   TCO2 23 0 - 100 mmol/L   Hemoglobin 17.0 13.0 - 17.0 g/dL   HCT 78.250.0 95.639.0 - 21.352.0 %   Dg Wrist Complete Right  10/05/2014   CLINICAL DATA:  Ulnar wrist pain since 10/02/2014.  No known injury.  EXAM: RIGHT WRIST - COMPLETE 3+ VIEW  COMPARISON:  None.  FINDINGS: No acute bony  or joint abnormality is identified. A punctate calcifications is seen off the ulnar styloid and may be due to early chondrocalcinosis. Soft tissue otherwise unremarkable.  IMPRESSION: No acute abnormality.  Punctate calcification off the ulnar styloid may be due to early chondrocalcinosis.   Electronically Signed   By: Drusilla Kannerhomas  Dalessio M.D.   On: 10/05/2014 14:39    Assessment and Plan: 44 y.o. male with wrist pain and swelling. I'm concerned for gout. I'm doubtful for de Quervain's tenosynovitis or any isolated tenosynovitis. His pain is diffuse and he is a joint effusion. This is more likely gout versus pseudogout. Treat with prednisone and colchicine. Continue wrist brace. Follow-up with rheumatology. Uric acid pending.  Discussed warning signs or symptoms. Please see discharge instructions. Patient expresses understanding.     Rodolph BongEvan S Henri Baumler, MD 10/05/14 [id number]  Rodolph BongEvan S Arda Daggs, MD 10/05/14 1509    Addendum:   Note Uric acid 8.4  Rodolph BongEvan S Janille Draughon, MD 10/05/14 1740

## 2014-10-05 NOTE — ED Notes (Signed)
Pt was seen on 10/03/2014 for arm pain and he states that he is still having pain in the arm

## 2014-10-05 NOTE — Discharge Instructions (Signed)
Thank you for coming in today. ° ° °Gout °Gout is an inflammatory arthritis caused by a buildup of uric acid crystals in the joints. Uric acid is a chemical that is normally present in the blood. When the level of uric acid in the blood is too high it can form crystals that deposit in your joints and tissues. This causes joint redness, soreness, and swelling (inflammation). Repeat attacks are common. Over time, uric acid crystals can form into masses (tophi) near a joint, destroying bone and causing disfigurement. Gout is treatable and often preventable. °CAUSES  °The disease begins with elevated levels of uric acid in the blood. Uric acid is produced by your body when it breaks down a naturally found substance called purines. Certain foods you eat, such as meats and fish, contain high amounts of purines. Causes of an elevated uric acid level include: °· Being passed down from parent to child (heredity). °· Diseases that cause increased uric acid production (such as obesity, psoriasis, and certain cancers). °· Excessive alcohol use. °· Diet, especially diets rich in meat and seafood. °· Medicines, including certain cancer-fighting medicines (chemotherapy), water pills (diuretics), and aspirin. °· Chronic kidney disease. The kidneys are no longer able to remove uric acid well. °· Problems with metabolism. °Conditions strongly associated with gout include: °· Obesity. °· High blood pressure. °· High cholesterol. °· Diabetes. °Not everyone with elevated uric acid levels gets gout. It is not understood why some people get gout and others do not. Surgery, joint injury, and eating too much of certain foods are some of the factors that can lead to gout attacks. °SYMPTOMS  °· An attack of gout comes on quickly. It causes intense pain with redness, swelling, and warmth in a joint. °· Fever can occur. °· Often, only one joint is involved. Certain joints are more commonly involved: °¨ Base of the big  toe. °¨ Knee. °¨ Ankle. °¨ Wrist. °¨ Finger. °Without treatment, an attack usually goes away in a few days to weeks. Between attacks, you usually will not have symptoms, which is different from many other forms of arthritis. °DIAGNOSIS  °Your caregiver will suspect gout based on your symptoms and exam. In some cases, tests may be recommended. The tests may include: °· Blood tests. °· Urine tests. °· X-rays. °· Joint fluid exam. This exam requires a needle to remove fluid from the joint (arthrocentesis). Using a microscope, gout is confirmed when uric acid crystals are seen in the joint fluid. °TREATMENT  °There are two phases to gout treatment: treating the sudden onset (acute) attack and preventing attacks (prophylaxis). °· Treatment of an Acute Attack. °¨ Medicines are used. These include anti-inflammatory medicines or steroid medicines. °¨ An injection of steroid medicine into the affected joint is sometimes necessary. °¨ The painful joint is rested. Movement can worsen the arthritis. °¨ You may use warm or cold treatments on painful joints, depending which works best for you. °· Treatment to Prevent Attacks. °¨ If you suffer from frequent gout attacks, your caregiver may advise preventive medicine. These medicines are started after the acute attack subsides. These medicines either help your kidneys eliminate uric acid from your body or decrease your uric acid production. You may need to stay on these medicines for a very long time. °¨ The early phase of treatment with preventive medicine can be associated with an increase in acute gout attacks. For this reason, during the first few months of treatment, your caregiver may also advise you to take medicines usually   used for acute gout treatment. Be sure you understand your caregiver's directions. Your caregiver may make several adjustments to your medicine dose before these medicines are effective. °¨ Discuss dietary treatment with your caregiver or dietitian.  Alcohol and drinks high in sugar and fructose and foods such as meat, poultry, and seafood can increase uric acid levels. Your caregiver or dietitian can advise you on drinks and foods that should be limited. °HOME CARE INSTRUCTIONS  °· Do not take aspirin to relieve pain. This raises uric acid levels. °· Only take over-the-counter or prescription medicines for pain, discomfort, or fever as directed by your caregiver. °· Rest the joint as much as possible. When in bed, keep sheets and blankets off painful areas. °· Keep the affected joint raised (elevated). °· Apply warm or cold treatments to painful joints. Use of warm or cold treatments depends on which works best for you. °· Use crutches if the painful joint is in your leg. °· Drink enough fluids to keep your urine clear or pale yellow. This helps your body get rid of uric acid. Limit alcohol, sugary drinks, and fructose drinks. °· Follow your dietary instructions. Pay careful attention to the amount of protein you eat. Your daily diet should emphasize fruits, vegetables, whole grains, and fat-free or low-fat milk products. Discuss the use of coffee, vitamin C, and cherries with your caregiver or dietitian. These may be helpful in lowering uric acid levels. °· Maintain a healthy body weight. °SEEK MEDICAL CARE IF:  °· You develop diarrhea, vomiting, or any side effects from medicines. °· You do not feel better in 24 hours, or you are getting worse. °SEEK IMMEDIATE MEDICAL CARE IF:  °· Your joint becomes suddenly more tender, and you have chills or a fever. °MAKE SURE YOU:  °· Understand these instructions. °· Will watch your condition. °· Will get help right away if you are not doing well or get worse. °Document Released: 05/05/2000 Document Revised: 09/22/2013 Document Reviewed: 12/20/2011 °[employer]® Patient Information ©2015 [employer]. This information is not intended to replace advice given to you by your health care provider. Make sure you discuss any  questions you have with your health care provider. ° °

## 2014-10-07 NOTE — ED Notes (Signed)
Uric Acid 8.4 H. Treated with Prednisone and Colchicine.  Dr. Denyse Amassorey reviewed. Sent message asking if pt. needs notified. Vassie MoselleYork, Lavetta Geier M 10/07/2014

## 2014-10-08 ENCOUNTER — Telehealth (HOSPITAL_COMMUNITY): Payer: Self-pay | Admitting: *Deleted

## 2014-10-08 NOTE — ED Notes (Signed)
Uric acid 8.4 H.  Dr. Denyse Amassorey wants pt. notified of his result.  Pt. has no working phone numbers.  Letter sent with result and instruction to f/u with a PCP. 10/08/2014

## 2014-11-14 ENCOUNTER — Encounter (HOSPITAL_COMMUNITY): Payer: Self-pay | Admitting: Emergency Medicine

## 2014-11-14 ENCOUNTER — Emergency Department (INDEPENDENT_AMBULATORY_CARE_PROVIDER_SITE_OTHER)
Admission: EM | Admit: 2014-11-14 | Discharge: 2014-11-14 | Disposition: A | Discharge status: Home / Self Care | Payer: 59 | Source: Home / Self Care | Attending: Family Medicine | Admitting: Family Medicine

## 2014-11-14 DIAGNOSIS — I10 Essential (primary) hypertension: Secondary | ICD-10-CM

## 2014-11-14 DIAGNOSIS — Z9119 Patient's noncompliance with other medical treatment and regimen: Secondary | ICD-10-CM

## 2014-11-14 DIAGNOSIS — M109 Gout, unspecified: Secondary | ICD-10-CM

## 2014-11-14 DIAGNOSIS — M10071 Idiopathic gout, right ankle and foot: Secondary | ICD-10-CM | POA: Diagnosis not present

## 2014-11-14 DIAGNOSIS — M25539 Pain in unspecified wrist: Secondary | ICD-10-CM

## 2014-11-14 DIAGNOSIS — Z91199 Patient's noncompliance with other medical treatment and regimen due to unspecified reason: Secondary | ICD-10-CM

## 2014-11-14 HISTORY — DX: Gout, unspecified: M10.9

## 2014-11-14 MED ORDER — PREDNISONE 20 MG PO TABS: ORAL_TABLET | ORAL | Status: DC

## 2014-11-14 MED ORDER — METOPROLOL TARTRATE 25 MG PO TABS: ORAL_TABLET | ORAL | Status: DC

## 2014-11-14 MED ORDER — LOSARTAN POTASSIUM 50 MG PO TABS: 50.0000 mg | ORAL_TABLET | Freq: Every day | ORAL | Status: DC

## 2014-11-14 MED ORDER — COLCHICINE 0.6 MG PO TABS: 0.6000 mg | ORAL_TABLET | Freq: Every day | ORAL | Status: DC

## 2014-11-14 NOTE — ED Notes (Signed)
Pt states that he is having another gout flare up in his right foot.

## 2014-11-14 NOTE — Discharge Instructions (Signed)
Gout Gout is when your joints become red, sore, and swell (inflamed). This is caused by the buildup of uric acid crystals in the joints. Uric acid is a chemical that is normally in the blood. If the level of uric acid gets too high in the blood, these crystals form in your joints and tissues. Over time, these crystals can form into masses near the joints and tissues. These masses can destroy bone and cause the bone to look misshapen (deformed). HOME CARE   Do not take aspirin for pain.  Only take medicine as told by your doctor.  Rest the joint as much as you can. When in bed, keep sheets and blankets off painful areas.  Keep the sore joints raised (elevated).  Put warm or cold packs on painful joints. Use of warm or cold packs depends on which works best for you.  Use crutches if the painful joint is in your leg.  Drink enough fluids to keep your pee (urine) clear or pale yellow. Limit alcohol, sugary drinks, and drinks with fructose in them.  Follow your diet instructions. Pay careful attention to how much protein you eat. Include fruits, vegetables, whole grains, and fat-free or low-fat milk products in your daily diet. Talk to your doctor or dietitian about the use of coffee, vitamin C, and cherries. These may help lower uric acid levels.  Keep a healthy body weight. GET HELP RIGHT AWAY IF:   You have watery poop (diarrhea), throw up (vomit), or have any side effects from medicines.  You do not feel better in 24 hours, or you are getting worse.  Your joint becomes suddenly more tender, and you have chills or a fever. MAKE SURE YOU:   Understand these instructions.  Will watch your condition.  Will get help right away if you are not doing well or get worse. Document Released: 02/15/2008 Document Revised: 09/22/2013 Document Reviewed: 12/20/2011 [facility] Patient Information 2015 [place], [place]. This information is not intended to replace advice given to you by your health care  provider. Make sure you discuss any questions you have with your health care provider.  Hypertension Hypertension, commonly called high blood pressure, is when the force of blood pumping through your arteries is too strong. Your arteries are the blood vessels that carry blood from your heart throughout your body. A blood pressure reading consists of a higher number over a lower number, such as 110/72. The higher number (systolic) is the pressure inside your arteries when your heart pumps. The lower number (diastolic) is the pressure inside your arteries when your heart relaxes. Ideally you want your blood pressure below 120/80. Hypertension forces your heart to work harder to pump blood. Your arteries may become narrow or stiff. Having hypertension puts you at risk for heart disease, stroke, and other problems.  RISK FACTORS Some risk factors for high blood pressure are controllable. Others are not.  Risk factors you cannot control include:   Race. You may be at higher risk if you are African American.  Age. Risk increases with age.  Gender. Men are at higher risk than women before age 17 years. After age 53, women are at higher risk than men. Risk factors you can control include:  Not getting enough exercise or physical activity.  Being overweight.  Getting too much fat, sugar, calories, or salt in your diet.  Drinking too much alcohol. SIGNS AND SYMPTOMS Hypertension does not usually cause signs or symptoms. Extremely high blood pressure (hypertensive crisis) may cause headache,  anxiety, shortness of breath, and nosebleed. DIAGNOSIS  To check if you have hypertension, your health care provider will measure your blood pressure while you are seated, with your arm held at the level of your heart. It should be measured at least twice using the same arm. Certain conditions can cause a difference in blood pressure between your right and left arms. A blood pressure reading that is higher than  normal on one occasion does not mean that you need treatment. If one blood pressure reading is high, ask your health care provider about having it checked again. TREATMENT  Treating high blood pressure includes making lifestyle changes and possibly taking medicine. Living a healthy lifestyle can help lower high blood pressure. You may need to change some of your habits. Lifestyle changes may include:  Following the DASH diet. This diet is high in fruits, vegetables, and whole grains. It is low in salt, red meat, and added sugars.  Getting at least 2 hours of brisk physical activity every week.  Losing weight if necessary.  Not smoking.  Limiting alcoholic beverages.  Learning ways to reduce stress. If lifestyle changes are not enough to get your blood pressure under control, your health care provider may prescribe medicine. You may need to take more than one. Work closely with your health care provider to understand the risks and benefits. HOME CARE INSTRUCTIONS  Have your blood pressure rechecked as directed by your health care provider.   Take medicines only as directed by your health care provider. Follow the directions carefully. Blood pressure medicines must be taken as prescribed. The medicine does not work as well when you skip doses. Skipping doses also puts you at risk for problems.   Do not smoke.   Monitor your blood pressure at home as directed by your health care provider. SEEK MEDICAL CARE IF:   You think you are having a reaction to medicines taken.  You have recurrent headaches or feel dizzy.  You have swelling in your ankles.  You have trouble with your vision. SEEK IMMEDIATE MEDICAL CARE IF:  You develop a severe headache or confusion.  You have unusual weakness, numbness, or feel faint.  You have severe chest or abdominal pain.  You vomit repeatedly.  You have trouble breathing. MAKE SURE YOU:   Understand these instructions.  Will watch your  condition.  Will get help right away if you are not doing well or get worse. Document Released: 05/08/2005 Document Revised: 09/22/2013 Document Reviewed: 02/28/2013 [facility] Patient Information 2015 [place], [place]. This information is not intended to replace advice given to you by your health care provider. Make sure you discuss any questions you have with your health care provider.  Wrist Pain A wrist sprain happens when the bands of tissue that hold the wrist joints together (ligament) stretch too much or tear. A wrist strain happens when muscles or bands of tissue that connect muscles to bones (tendons) are stretched or pulled. HOME CARE  Put ice on the injured area.  Put ice in a plastic bag.  Place a towel between your skin and the bag.  Leave the ice on for 15-20 minutes, 03-04 times a day, for the first 2 days.  Raise (elevate) the injured wrist to lessen puffiness (swelling).  Rest the injured wrist for at least 48 hours or as told by your doctor.  Wear a splint, cast, or an elastic wrap as told by your doctor.  Only take medicine as told by your doctor.  Follow up with your doctor as told. This is important. GET HELP RIGHT AWAY IF:   The fingers are puffy, very red, white, or cold and blue.  The fingers lose feeling (numb) or tingle.  The pain gets worse.  It is hard to move the fingers. MAKE SURE YOU:   Understand these instructions.  Will watch your condition.  Will get help right away if you are not doing well or get worse. Document Released: 10/25/2007 Document Revised: 07/31/2011 Document Reviewed: 06/29/2010 [facility] Patient Information 2015 [place], [place]. This information is not intended to replace advice given to you by your health care provider. Make sure you discuss any questions you have with your health care provider.

## 2014-11-14 NOTE — ED Provider Notes (Signed)
CSN: [id number]     Arrival date & time 11/14/14  1410 History   First MD Initiated Contact with Patient 11/14/14 1505     Chief Complaint  Patient presents with  . Gout  . Foot Pain   (Consider location/radiation/quality/duration/timing/severity/associated sxs/prior Treatment) HPI Comments: 44 year old male complaining of pain and began yesterday in the left wrist. There is point tenderness over the trapezoid carpal. There is no swelling or erythema. There is some pain with extension and flexion. He has been seen for this here at least twice and diagnosed with gout and elevated uric acid level.  2 days ago he developed pain in the right foot primarily to the dorsal aspect and radiating to the lateral aspect. No known injury.   Past Medical History  Diagnosis Date  . Hypertension   . Gout    History reviewed. No pertinent past surgical history. History reviewed. No pertinent family history. History  Substance Use Topics  . Smoking status: Current Every Day Smoker -- 0.25 packs/day    Types: Cigarettes  . Smokeless tobacco: Not on file  . Alcohol Use: No    Review of Systems  Constitutional: Positive for activity change. Negative for fever and fatigue.  HENT: Negative.   Respiratory: Negative for cough, shortness of breath and wheezing.   Cardiovascular: Negative for chest pain and leg swelling.  Gastrointestinal: Negative.   Genitourinary: Negative.   Musculoskeletal: Positive for arthralgias.  Skin: Negative for color change.  Neurological: Negative.     Allergies  Review of patient's allergies indicates no known allergies.  Home Medications   Prior to Admission medications   Medication Sig Start Date End Date Taking? Authorizing Provider  colchicine 0.6 MG tablet Take 1 tablet (0.6 mg total) by mouth daily. 11/14/14   Hayden Rasmussen, NP  ibuprofen (ADVIL,MOTRIN) 800 MG tablet Take 1 tablet (800 mg total) by mouth 3 (three) times daily. For 1 week, then as needed 10/03/14    Charm Rings, MD  losartan (COZAAR) 50 MG tablet Take 1 tablet (50 mg total) by mouth daily. 11/14/14   Hayden Rasmussen, NP  metoprolol (LOPRESSOR) 25 MG tablet One tablet bid 11/14/14   Hayden Rasmussen, NP  predniSONE (DELTASONE) 20 MG tablet Take 3 tabs po on first day, 2 tabs second day, 2 tabs third day, 1 tab fourth day, 1 tab 5th day. Take with food. 11/14/14   Hayden Rasmussen, NP  traMADol (ULTRAM) 50 MG tablet Take 1 tablet (50 mg total) by mouth every 6 (six) hours as needed. 10/03/14   Charm Rings, MD   BP 174/124 mmHg  Pulse 68  Temp(Src) 98.8 F (37.1 C) (Oral)  Resp 20  SpO2 97% Physical Exam  Constitutional: He is oriented to person, place, and time. He appears well-developed and well-nourished. No distress.  Eyes: EOM are normal.  Neck: Normal range of motion. Neck supple.  Cardiovascular: Normal rate and regular rhythm.   Pulmonary/Chest: Effort normal. He has no wheezes.  Musculoskeletal:  Left wrist with no swelling, deformity, discoloration. Full range of motion. Tenderness to the dorsal wrist directly over the trapezoid carpal bone.  There is swelling and tenderness over the dorsal aspect of the right foot. Lesser to the lateral aspect. No erythema. Pedal pulses 2+. Plantarflexion intact. Dorsiflexion with decreased range of motion due to dorsal pain.  Neurological: He is alert and oriented to person, place, and time. He exhibits normal muscle tone.  Skin: Skin is warm and dry. No erythema.  Psychiatric: He  has a normal mood and affect.  Nursing note and vitals reviewed.   ED Course  Procedures (including critical care time) Labs Review Labs Reviewed - No data to display  Imaging Review No results found.   MDM   1. Localized pain of wrist joint   2. Acute gout of right foot, unspecified cause   3. Essential hypertension   4. H/O noncompliance with medical treatment, presenting hazards to health    Losartan and metoprolol Colchicine 0.6mg  and prednisone F/U wit PCP   Dr. Denyse Amass, Card given.    Hayden Rasmussen, NP 11/14/14 1540

## 2014-12-09 ENCOUNTER — Encounter: Payer: Self-pay | Admitting: Family Medicine

## 2014-12-09 ENCOUNTER — Ambulatory Visit (INDEPENDENT_AMBULATORY_CARE_PROVIDER_SITE_OTHER): Payer: 59 | Admitting: Family Medicine

## 2014-12-09 VITALS — BP 144/80 | HR 63 | Ht 63.0 in | Wt 168.0 lb

## 2014-12-09 DIAGNOSIS — E669 Obesity, unspecified: Secondary | ICD-10-CM | POA: Diagnosis not present

## 2014-12-09 DIAGNOSIS — Z720 Tobacco use: Secondary | ICD-10-CM | POA: Diagnosis not present

## 2014-12-09 DIAGNOSIS — M1A0720 Idiopathic chronic gout, left ankle and foot, without tophus (tophi): Secondary | ICD-10-CM

## 2014-12-09 DIAGNOSIS — F17200 Nicotine dependence, unspecified, uncomplicated: Secondary | ICD-10-CM

## 2014-12-09 DIAGNOSIS — I10 Essential (primary) hypertension: Secondary | ICD-10-CM | POA: Diagnosis not present

## 2014-12-09 MED ORDER — COLCHICINE 0.6 MG PO TABS: 0.6000 mg | ORAL_TABLET | Freq: Every day | ORAL | Status: DC

## 2014-12-09 MED ORDER — LOSARTAN POTASSIUM 50 MG PO TABS: 50.0000 mg | ORAL_TABLET | Freq: Every day | ORAL | Status: DC

## 2014-12-09 MED ORDER — ALLOPURINOL 100 MG PO TABS: 100.0000 mg | ORAL_TABLET | Freq: Every day | ORAL | Status: DC

## 2014-12-09 MED ORDER — METOPROLOL TARTRATE 25 MG PO TABS: ORAL_TABLET | ORAL | Status: DC

## 2014-12-09 NOTE — Assessment & Plan Note (Signed)
Continue losartan and metoprolol. Check CMP and direct LDL today. Repeat blood pressure in one month if still elevated will add or increase blood pressure medications.

## 2014-12-09 NOTE — Assessment & Plan Note (Signed)
Start allopurinol to lower uric acid with colchicine prophylaxis. Check uric acid today.

## 2014-12-09 NOTE — Patient Instructions (Signed)
Thank you for coming in today. Return in 1 month for recheck.  Call or go to the emergency room if you get worse, have trouble breathing, have chest pains, or palpitations.   Gout Gout is an inflammatory arthritis caused by a buildup of uric acid crystals in the joints. Uric acid is a chemical that is normally present in the blood. When the level of uric acid in the blood is too high it can form crystals that deposit in your joints and tissues. This causes joint redness, soreness, and swelling (inflammation). Repeat attacks are common. Over time, uric acid crystals can form into masses (tophi) near a joint, destroying bone and causing disfigurement. Gout is treatable and often preventable. CAUSES  The disease begins with elevated levels of uric acid in the blood. Uric acid is produced by your body when it breaks down a naturally found substance called purines. Certain foods you eat, such as meats and fish, contain high amounts of purines. Causes of an elevated uric acid level include:  Being passed down from parent to child (heredity).  Diseases that cause increased uric acid production (such as obesity, psoriasis, and certain cancers).  Excessive alcohol use.  Diet, especially diets rich in meat and seafood.  Medicines, including certain cancer-fighting medicines (chemotherapy), water pills (diuretics), and aspirin.  Chronic kidney disease. The kidneys are no longer able to remove uric acid well.  Problems with metabolism. Conditions strongly associated with gout include:  Obesity.  High blood pressure.  High cholesterol.  Diabetes. Not everyone with elevated uric acid levels gets gout. It is not understood why some people get gout and others do not. Surgery, joint injury, and eating too much of certain foods are some of the factors that can lead to gout attacks. SYMPTOMS   An attack of gout comes on quickly. It causes intense pain with redness, swelling, and warmth in a  joint.  Fever can occur.  Often, only one joint is involved. Certain joints are more commonly involved:  Base of the big toe.  Knee.  Ankle.  Wrist.  Finger. Without treatment, an attack usually goes away in a few days to weeks. Between attacks, you usually will not have symptoms, which is different from many other forms of arthritis. DIAGNOSIS  Your caregiver will suspect gout based on your symptoms and exam. In some cases, tests may be recommended. The tests may include:  Blood tests.  Urine tests.  X-rays.  Joint fluid exam. This exam requires a needle to remove fluid from the joint (arthrocentesis). Using a microscope, gout is confirmed when uric acid crystals are seen in the joint fluid. TREATMENT  There are two phases to gout treatment: treating the sudden onset (acute) attack and preventing attacks (prophylaxis).  Treatment of an Acute Attack.  Medicines are used. These include anti-inflammatory medicines or steroid medicines.  An injection of steroid medicine into the affected joint is sometimes necessary.  The painful joint is rested. Movement can worsen the arthritis.  You may use warm or cold treatments on painful joints, depending which works best for you.  Treatment to Prevent Attacks.  If you suffer from frequent gout attacks, your caregiver may advise preventive medicine. These medicines are started after the acute attack subsides. These medicines either help your kidneys eliminate uric acid from your body or decrease your uric acid production. You may need to stay on these medicines for a very long time.  The early phase of treatment with preventive medicine can be associated with  an increase in acute gout attacks. For this reason, during the first few months of treatment, your caregiver may also advise you to take medicines usually used for acute gout treatment. Be sure you understand your caregiver's directions. Your caregiver may make several adjustments  to your medicine dose before these medicines are effective.  Discuss dietary treatment with your caregiver or dietitian. Alcohol and drinks high in sugar and fructose and foods such as meat, poultry, and seafood can increase uric acid levels. Your caregiver or dietitian can advise you on drinks and foods that should be limited. HOME CARE INSTRUCTIONS   Do not take aspirin to relieve pain. This raises uric acid levels.  Only take over-the-counter or prescription medicines for pain, discomfort, or fever as directed by your caregiver.  Rest the joint as much as possible. When in bed, keep sheets and blankets off painful areas.  Keep the affected joint raised (elevated).  Apply warm or cold treatments to painful joints. Use of warm or cold treatments depends on which works best for you.  Use crutches if the painful joint is in your leg.  Drink enough fluids to keep your urine clear or pale yellow. This helps your body get rid of uric acid. Limit alcohol, sugary drinks, and fructose drinks.  Follow your dietary instructions. Pay careful attention to the amount of protein you eat. Your daily diet should emphasize fruits, vegetables, whole grains, and fat-free or low-fat milk products. Discuss the use of coffee, vitamin C, and cherries with your caregiver or dietitian. These may be helpful in lowering uric acid levels.  Maintain a healthy body weight. SEEK MEDICAL CARE IF:   You develop diarrhea, vomiting, or any side effects from medicines.  You do not feel better in 24 hours, or you are getting worse. SEEK IMMEDIATE MEDICAL CARE IF:   Your joint becomes suddenly more tender, and you have chills or a fever. MAKE SURE YOU:   Understand these instructions.  Will watch your condition.  Will get help right away if you are not doing well or get worse. Document Released: 05/05/2000 Document Revised: 09/22/2013 Document Reviewed: 12/20/2011 [facility] [place] [facility] Patient Information 2015 [place], [place].  This information is not intended to replace advice given to you by your health care provider. Make sure you discuss any questions you have with your health care provider.

## 2014-12-09 NOTE — Progress Notes (Signed)
Marin OlpSunil Wohlford is a 44 y.o. male who [facility]  today for establish care discuss hypertension and gout. 1) hypertension. Patient was diagnosed with hypertension recently and started on losartan and metoprolol. He tolerates his medicines well with no chest pains palpitations or shortness of breath. She typically takes the medicine at bedtime.  2) gout: Patient has had multiple episodes of acute gout attacks and his left wrist and left ankle. His uric acid is a measured elevated in the eights a month ago and in the 11th 3 years ago. He has not modified his diet. He does not take urine acid lowering medicine. His most recent gout attack was treated successfully with colchicine. He no longer has pain.  He smokes currently.   Past Medical History  Diagnosis Date  . Hypertension   . Gout    History reviewed. No pertinent past surgical history. History  Substance Use Topics  . Smoking status: Current Every Day Smoker -- 0.25 packs/day    Types: Cigarettes  . Smokeless tobacco: Not on file  . Alcohol Use: No   ROS as above Medications: Current Outpatient Prescriptions  Medication Sig Dispense Refill  . losartan (COZAAR) 50 MG tablet Take 1 tablet (50 mg total) by mouth daily. 30 tablet 6  . metoprolol tartrate (LOPRESSOR) 25 MG tablet One tablet bid 60 tablet 6  . allopurinol (ZYLOPRIM) 100 MG tablet Take 1 tablet (100 mg total) by mouth daily. 30 tablet 6  . colchicine 0.6 MG tablet Take 1 tablet (0.6 mg total) by mouth daily. 30 tablet 5   No current facility-administered medications for this visit.   No Known Allergies   Exam:  BP 144/80 mmHg  Pulse 63  Ht 5\' 3"  (1.6 m)  Wt 168 lb (76.204 kg)  BMI 29.77 kg/m2 Gen: Well NAD HEENT: EOMI,  MMM Lungs: Normal work of breathing. CTABL Heart: RRR no MRG Abd: NABS, Soft. Nondistended, Nontender Exts: Brisk capillary refill, warm and well perfused. Extremities are nontender  No  results found for this or any previous visit (from the past 24 hour(s)). No results found.   Please see individual assessment and plan sections.

## 2014-12-10 ENCOUNTER — Telehealth: Payer: Self-pay | Admitting: Family Medicine

## 2014-12-10 LAB — COMPLETE METABOLIC PANEL WITH GFR
ALT: 38 U/L (ref 0–53)
AST: 24 U/L (ref 0–37)
Albumin: 4.1 g/dL (ref 3.5–5.2)
Alkaline Phosphatase: 79 U/L (ref 39–117)
BUN: 7 mg/dL (ref 6–23)
CALCIUM: 10.2 mg/dL (ref 8.4–10.5)
CHLORIDE: 103 meq/L (ref 96–112)
CO2: 26 meq/L (ref 19–32)
CREATININE: 2.82 mg/dL — AB (ref 0.50–1.35)
GFR, EST NON AFRICAN AMERICAN: 27 mL/min — AB
GFR, Est African American: 31 mL/min — ABNORMAL LOW
GLUCOSE: 79 mg/dL (ref 70–99)
Potassium: 4 mEq/L (ref 3.5–5.3)
SODIUM: 139 meq/L (ref 135–145)
TOTAL PROTEIN: 7.1 g/dL (ref 6.0–8.3)
Total Bilirubin: 0.4 mg/dL (ref 0.2–1.2)

## 2014-12-10 LAB — CBC
HCT: 44.6 % (ref 39.0–52.0)
HEMOGLOBIN: 14.8 g/dL (ref 13.0–17.0)
MCH: 26.2 pg (ref 26.0–34.0)
MCHC: 33.2 g/dL (ref 30.0–36.0)
MCV: 79.1 fL (ref 78.0–100.0)
MPV: 9.8 fL (ref 8.6–12.4)
Platelets: 239 10*3/uL (ref 150–400)
RBC: 5.64 MIL/uL (ref 4.22–5.81)
RDW: 14.4 % (ref 11.5–15.5)
WBC: 7.2 10*3/uL (ref 4.0–10.5)

## 2014-12-10 LAB — URIC ACID: Uric Acid, Serum: 10 mg/dL — ABNORMAL HIGH (ref 4.0–7.8)

## 2014-12-10 LAB — LDL CHOLESTEROL, DIRECT: LDL DIRECT: 114 mg/dL — AB

## 2014-12-10 NOTE — Telephone Encounter (Signed)
Creatinine is very elevated. I attempted to contact the patient however he did not answer his phone and his voicemail box is not been set up yet. I'll attempt to call the patient. Recommend stopping immediately losartan colchicine and allopurinol. Return to clinic on early next week for repeat lab check. CMA will continue to attempt to contact the patient

## 2014-12-11 NOTE — Telephone Encounter (Signed)
Pt notified of results and recommendations.

## 2014-12-18 ENCOUNTER — Ambulatory Visit (INDEPENDENT_AMBULATORY_CARE_PROVIDER_SITE_OTHER): Payer: 59 | Admitting: Family Medicine

## 2014-12-18 ENCOUNTER — Encounter: Payer: Self-pay | Admitting: Family Medicine

## 2014-12-18 VITALS — BP 146/103 | HR 62 | Wt 167.0 lb

## 2014-12-18 DIAGNOSIS — M109 Gout, unspecified: Secondary | ICD-10-CM | POA: Diagnosis not present

## 2014-12-18 DIAGNOSIS — N179 Acute kidney failure, unspecified: Secondary | ICD-10-CM | POA: Diagnosis not present

## 2014-12-18 DIAGNOSIS — I10 Essential (primary) hypertension: Secondary | ICD-10-CM

## 2014-12-18 LAB — BASIC METABOLIC PANEL WITH GFR
BUN: 14 mg/dL (ref 7–25)
CALCIUM: 10.2 mg/dL (ref 8.6–10.3)
CO2: 26 mmol/L (ref 20–31)
CREATININE: 0.93 mg/dL (ref 0.60–1.35)
Chloride: 105 mmol/L (ref 98–110)
GFR, Est Non African American: >89 mL/min (ref >=60)
GLUCOSE: 72 mg/dL (ref 65–99)
Potassium: 4 mmol/L (ref 3.5–5.3)
Sodium: 141 mmol/L (ref 135–146)

## 2014-12-18 MED ORDER — PREDNISONE 20 MG PO TABS: ORAL_TABLET | ORAL | Status: AC

## 2014-12-18 MED ORDER — METOPROLOL TARTRATE 50 MG PO TABS: ORAL_TABLET | ORAL | Status: DC

## 2014-12-18 NOTE — Progress Notes (Signed)
CC: Race Billy Beard is a 44 y.o. male is here for f/u labs   Subjective: HPI:   Acute kidney failure: last week patient was found to have a drastic decrease in his filtration rate a few weeks after starting losartan. He feels that he is urinating more than he is used to and continues to have left wrist pain but otherwise feels like his in his normal state of health. Based on chart review he was contacted to stop taking losartan and allopurinol but he tells me he never got this message. Denies confusion, muscle cramps, weakness or any motor or sensory disturbances. Denies any irregular heart rate.  Follow-up essential hypertension: Denies exertional chest pain. Taking both losartan and metoprolol. No outside blood pressures report.  Complains of left wrist pain that has been present for matter of weeks. He tells me it feels like his typical gout that he gets in his left wrist. It has not improved despite starting allopurinol, he has not been taking colchicine due to the price. Currently denies any swelling redness or warmth of any joints. Pain in the left wrist is mild, worse with hammering.   Review Of Systems Outlined In HPI  Past Medical History  Diagnosis Date  . Hypertension   . Gout     No past surgical history on file. No family history on file.  History   Social History  . Marital Status: Single    Spouse Name: N/A  . Number of Children: N/A  . Years of Education: N/A   Occupational History  . Not on file.   Social History Main Topics  . Smoking status: Current Every Day Smoker -- 0.25 packs/day    Types: Cigarettes  . Smokeless tobacco: Not on file  . Alcohol Use: No  . Drug Use: No  . Sexual Activity: Not on file   Other Topics Concern  . Not on file   Social History Narrative     Objective: BP 146/103 mmHg  Pulse 62  Wt 167 lb (75.751 kg)  General: Alert and Oriented, No Acute Distress HEENT: Pupils equal, round, reactive to light. Conjunctivae clear.   Moist mucous membranes Lungs: Clear to auscultation bilaterally, no wheezing/ronchi/rales.  Comfortable work of breathing. Good air movement. Cardiac: Regular rate and rhythm. Normal S1/S2.  No murmurs, rubs, nor gallops.   Extremities: No peripheral edema.  Strong peripheral pulses.  Mental Status: No depression, anxiety, nor agitation. Skin: Warm and dry.  Assessment & Plan: [place] was seen today for f/u labs.  Diagnoses and all orders for this visit:  Essential hypertension Orders: -     BASIC METABOLIC PANEL WITH GFR -     metoprolol tartrate (LOPRESSOR) 50 MG tablet; One tablet bid  Acute renal failure, unspecified acute renal failure type Orders: -     BASIC METABOLIC PANEL WITH GFR  Gout without tophus, unspecified cause, unspecified chronicity, unspecified site Orders: -     predniSONE (DELTASONE) 20 MG tablet; Three tabs daily days 1-3, two tabs daily days 4-6, one tab daily days 7-9, half tab daily days 10-13.   Essential hypertension: Uncontrolled increasing metoprolol. Acute renal failure: He was given both verbal and written instructions to stop taking losartan and allopurinol. Stressed the importance of having a repeat metabolic panel today. Avoid non-steroidal anti-inflammatories pending these results. I'm hopeful that his renal failure is only due to losartan, he will likely need to have a repeat renal basic metabolic panel in 3-4 days after stopping losartan unless his metabolic  panel today is normal Gout: Uncontrolled start prednisone taper.  Return if symptoms worsen or fail to improve.

## 2015-01-15 ENCOUNTER — Ambulatory Visit: Payer: 59 | Admitting: Family Medicine

## 2015-01-15 ENCOUNTER — Telehealth: Payer: Self-pay | Admitting: Family Medicine

## 2015-01-15 NOTE — Telephone Encounter (Signed)
Patient no showed on today's visit.  CMA will contact patient and inform him that he may die or become very sick or ill if he does not attend to his serious medical problems. Recommend that he return to clinic for recheck and reevaluation in the near future

## 2015-01-15 NOTE — Telephone Encounter (Signed)
Pt notified. States that he did not know about today's appt. Transferred him to scheduling for follow up appointment next week.

## 2015-01-22 ENCOUNTER — Ambulatory Visit (INDEPENDENT_AMBULATORY_CARE_PROVIDER_SITE_OTHER): Payer: 59 | Admitting: Family Medicine

## 2015-01-22 DIAGNOSIS — I10 Essential (primary) hypertension: Secondary | ICD-10-CM

## 2015-01-22 NOTE — Progress Notes (Signed)
Patient no-showed today's visit. We will send a letter stating that he must address his kidney failure or he is at risk of death or disability.

## 2015-01-29 ENCOUNTER — Ambulatory Visit: Payer: 59 | Admitting: Family Medicine

## 2015-03-09 ENCOUNTER — Encounter: Payer: Self-pay | Admitting: Family Medicine

## 2015-03-09 ENCOUNTER — Ambulatory Visit (INDEPENDENT_AMBULATORY_CARE_PROVIDER_SITE_OTHER): Payer: 59 | Admitting: Family Medicine

## 2015-03-09 VITALS — BP 152/93 | HR 48 | Wt 174.0 lb

## 2015-03-09 DIAGNOSIS — M10032 Idiopathic gout, left wrist: Secondary | ICD-10-CM | POA: Diagnosis not present

## 2015-03-09 DIAGNOSIS — I10 Essential (primary) hypertension: Secondary | ICD-10-CM

## 2015-03-09 LAB — COMPLETE METABOLIC PANEL WITH GFR
ALK PHOS: 92 U/L (ref 40–115)
ALT: 44 U/L (ref 9–46)
AST: 33 U/L (ref 10–40)
Albumin: 4 g/dL (ref 3.6–5.1)
BUN: 16 mg/dL (ref 7–25)
CHLORIDE: 105 mmol/L (ref 98–110)
CO2: 24 mmol/L (ref 20–31)
Calcium: 10.4 mg/dL — ABNORMAL HIGH (ref 8.6–10.3)
Creat: 0.79 mg/dL (ref 0.60–1.35)
GFR, Est African American: >89 mL/min (ref >=60)
GLUCOSE: 94 mg/dL (ref 65–99)
POTASSIUM: 4.3 mmol/L (ref 3.5–5.3)
SODIUM: 137 mmol/L (ref 135–146)
Total Bilirubin: 0.5 mg/dL (ref 0.2–1.2)
Total Protein: 6.8 g/dL (ref 6.1–8.1)

## 2015-03-09 LAB — URIC ACID: Uric Acid, Serum: 7.2 mg/dL (ref 4.0–7.8)

## 2015-03-09 MED ORDER — PREDNISONE 5 MG (48) PO TBPK: ORAL_TABLET | ORAL | Status: DC

## 2015-03-09 MED ORDER — HYDROCODONE-ACETAMINOPHEN 5-325 MG PO TABS: 1.0000 | ORAL_TABLET | Freq: Four times a day (QID) | ORAL | Status: DC | PRN

## 2015-03-09 MED ORDER — METOPROLOL TARTRATE 50 MG PO TABS: ORAL_TABLET | ORAL | Status: DC

## 2015-03-09 NOTE — Patient Instructions (Signed)
Thank you for coming in today. Follow up with Dr. Ivan Anchors   Gout Gout is an inflammatory arthritis caused by a buildup of uric acid crystals in the joints. Uric acid is a chemical that is normally present in the blood. When the level of uric acid in the blood is too high it can form crystals that deposit in your joints and tissues. This causes joint redness, soreness, and swelling (inflammation). Repeat attacks are common. Over time, uric acid crystals can form into masses (tophi) near a joint, destroying bone and causing disfigurement. Gout is treatable and often preventable. CAUSES  The disease begins with elevated levels of uric acid in the blood. Uric acid is produced by your body when it breaks down a naturally found substance called purines. Certain foods you eat, such as meats and fish, contain high amounts of purines. Causes of an elevated uric acid level include:  Being passed down from parent to child (heredity).  Diseases that cause increased uric acid production (such as obesity, psoriasis, and certain cancers).  Excessive alcohol use.  Diet, especially diets rich in meat and seafood.  Medicines, including certain cancer-fighting medicines (chemotherapy), water pills (diuretics), and aspirin.  Chronic kidney disease. The kidneys are no longer able to remove uric acid well.  Problems with metabolism. Conditions strongly associated with gout include:  Obesity.  High blood pressure.  High cholesterol.  Diabetes. Not everyone with elevated uric acid levels gets gout. It is not understood why some people get gout and others do not. Surgery, joint injury, and eating too much of certain foods are some of the factors that can lead to gout attacks. SYMPTOMS   An attack of gout comes on quickly. It causes intense pain with redness, swelling, and warmth in a joint.  Fever can occur.  Often, only one joint is involved. Certain joints are more commonly involved:  Base of the big  toe.  Knee.  Ankle.  Wrist.  Finger. Without treatment, an attack usually goes away in a few days to weeks. Between attacks, you usually will not have symptoms, which is different from many other forms of arthritis. DIAGNOSIS  Your caregiver will suspect gout based on your symptoms and exam. In some cases, tests may be recommended. The tests may include:  Blood tests.  Urine tests.  X-rays.  Joint fluid exam. This exam requires a needle to remove fluid from the joint (arthrocentesis). Using a microscope, gout is confirmed when uric acid crystals are seen in the joint fluid. TREATMENT  There are two phases to gout treatment: treating the sudden onset (acute) attack and preventing attacks (prophylaxis).  Treatment of an Acute Attack.  Medicines are used. These include anti-inflammatory medicines or steroid medicines.  An injection of steroid medicine into the affected joint is sometimes necessary.  The painful joint is rested. Movement can worsen the arthritis.  You may use warm or cold treatments on painful joints, depending which works best for you.  Treatment to Prevent Attacks.  If you suffer from frequent gout attacks, your caregiver may advise preventive medicine. These medicines are started after the acute attack subsides. These medicines either help your kidneys eliminate uric acid from your body or decrease your uric acid production. You may need to stay on these medicines for a very long time.  The early phase of treatment with preventive medicine can be associated with an increase in acute gout attacks. For this reason, during the first few months of treatment, your caregiver may also advise  you to take medicines usually used for acute gout treatment. Be sure you understand your caregiver's directions. Your caregiver may make several adjustments to your medicine dose before these medicines are effective.  Discuss dietary treatment with your caregiver or dietitian.  Alcohol and drinks high in sugar and fructose and foods such as meat, poultry, and seafood can increase uric acid levels. Your caregiver or dietitian can advise you on drinks and foods that should be limited. HOME CARE INSTRUCTIONS   Do not take aspirin to relieve pain. This raises uric acid levels.  Only take over-the-counter or prescription medicines for pain, discomfort, or fever as directed by your caregiver.  Rest the joint as much as possible. When in bed, keep sheets and blankets off painful areas.  Keep the affected joint raised (elevated).  Apply warm or cold treatments to painful joints. Use of warm or cold treatments depends on which works best for you.  Use crutches if the painful joint is in your leg.  Drink enough fluids to keep your urine clear or pale yellow. This helps your body get rid of uric acid. Limit alcohol, sugary drinks, and fructose drinks.  Follow your dietary instructions. Pay careful attention to the amount of protein you eat. Your daily diet should emphasize fruits, vegetables, whole grains, and fat-free or low-fat milk products. Discuss the use of coffee, vitamin C, and cherries with your caregiver or dietitian. These may be helpful in lowering uric acid levels.  Maintain a healthy body weight. SEEK MEDICAL CARE IF:   You develop diarrhea, vomiting, or any side effects from medicines.  You do not feel better in 24 hours, or you are getting worse. SEEK IMMEDIATE MEDICAL CARE IF:   Your joint becomes suddenly more tender, and you have chills or a fever. MAKE SURE YOU:   Understand these instructions.  Will watch your condition.  Will get help right away if you are not doing well or get worse.   This information is not intended to replace advice given to you by your health care provider. Make sure you discuss any questions you have with your health care provider.   Document Released: 05/05/2000 Document Revised: 05/29/2014 Document Reviewed:  12/20/2011 Elsevier Interactive Patient Education [employer]2016 [employer] Inc.

## 2015-03-09 NOTE — Assessment & Plan Note (Signed)
Start prednisone and Norco. Check uric acid and CMP.

## 2015-03-09 NOTE — Progress Notes (Signed)
Billy Beard is a 44 y.o. male who [facility]  today for gout. Patient has pain in his left wrist that he attributes to acute gout. The pain is consistent with previous episodes of gout in the past. He notes that this is been prescribed colchicine but cannot afford it. He denies any fevers chills nausea vomiting or diarrhea. Pain present for about a week. He denies any injury. He notes that he takes allopurinol and metoprolol.    Past Medical History  Diagnosis Date  . Hypertension   . Gout    No past surgical history on file. Social History  Substance Use Topics  . Smoking status: Current Every Day Smoker -- 0.25 packs/day    Types: Cigarettes  . Smokeless tobacco: Not on file  . Alcohol Use: No   family history is not on file.  ROS as above Medications: Current Outpatient Prescriptions  Medication Sig Dispense Refill  . allopurinol (ZYLOPRIM) 100 MG tablet Take 1 tablet (100 mg total) by mouth daily. 30 tablet 6  . metoprolol (LOPRESSOR) 50 MG tablet One tablet bid 60 tablet 2  . HYDROcodone-acetaminophen (NORCO/VICODIN) 5-325 MG tablet Take 1 tablet by mouth every 6 (six) hours as needed. 15 tablet 0  . predniSONE (STERAPRED UNI-PAK 48 TAB) 5 MG (48) TBPK tablet 12 day dosepack po 48 tablet 0   No current facility-administered medications for this visit.   No Known Allergies   Exam:  BP 152/93 mmHg  Pulse 48  Wt 174 lb (78.926 kg) Gen: Well NAD HEENT: EOMI,  MMM Lungs: Normal work of breathing. CTABL Heart: RRR no MRG Abd: NABS, Soft. Nondistended, Nontender Exts: Brisk capillary refill, warm and well perfused.  Left wrist: Swollen and tender along the dorsal aspect of the radial wrist. Pain with motion. Pulses capillary refill and sensation intact.  No results found for this or any previous visit (from the past 24 hour(s)). No results found.   Please see individual assessment and plan sections.  Of note patient  typically has Friday afternoons off. This conflicts with my administrative time. He is typically unable to make an appointment when I'm in the office.

## 2015-03-09 NOTE — Assessment & Plan Note (Signed)
Follow-up with Dr. Ivan AnchorsHommel. He will be his new PCP.

## 2015-04-09 ENCOUNTER — Other Ambulatory Visit: Payer: Self-pay | Admitting: Family Medicine

## 2015-04-09 DIAGNOSIS — I10 Essential (primary) hypertension: Secondary | ICD-10-CM

## 2015-04-09 MED ORDER — METOPROLOL TARTRATE 50 MG PO TABS: ORAL_TABLET | ORAL | Status: DC

## 2015-05-03 ENCOUNTER — Encounter: Payer: Self-pay | Admitting: Family Medicine

## 2015-05-03 ENCOUNTER — Ambulatory Visit (INDEPENDENT_AMBULATORY_CARE_PROVIDER_SITE_OTHER): Payer: 59 | Admitting: Family Medicine

## 2015-05-03 VITALS — BP 173/105 | HR 52 | Temp 98.2°F | Wt 178.0 lb

## 2015-05-03 DIAGNOSIS — I10 Essential (primary) hypertension: Secondary | ICD-10-CM

## 2015-05-03 DIAGNOSIS — M10072 Idiopathic gout, left ankle and foot: Secondary | ICD-10-CM

## 2015-05-03 MED ORDER — PREDNISONE 5 MG (48) PO TBPK: ORAL_TABLET | ORAL | Status: DC

## 2015-05-03 MED ORDER — HYDROCODONE-ACETAMINOPHEN 5-325 MG PO TABS
1.0000 | ORAL_TABLET | Freq: Four times a day (QID) | ORAL | Status: DC | PRN
Start: 2015-05-03 — End: 2015-05-14

## 2015-05-03 NOTE — Assessment & Plan Note (Signed)
Not well-controlled. Patient has significant increase in creatinine with Ace inhibitors. Follow-up with PCP.

## 2015-05-03 NOTE — Patient Instructions (Signed)
Thank you for coming in today. Make an appointment with Dr. Ivan AnchorsHommel soon to take care of gout and blood pressure.  Call or go to the emergency room if you get worse, have trouble breathing, have chest pains, or palpitations.   Get xray today.  Use the boot as needed.  Do not drive after taking hydrocodone.

## 2015-05-03 NOTE — Assessment & Plan Note (Signed)
Gout flare. Treat with prednisone and Norco. X-ray left foot pending. Recommend patient follow with PCP for further management of gout. Allopurinol without colchicine is likely contributing to his increased gout frequency..Marland Kitchen

## 2015-05-03 NOTE — Progress Notes (Signed)
Billy Beard is a 44 y.o. male who [facility] today for left foot pain. Patient is a long history of gout. He noted acute onset of left medial mid foot pain starting yesterday. He denies any injury. Pain is severe and consistent with gout. Additionally his hypertension. He continues to take his metoprolol. He continues to take his allopurinol however he cannot recall that he's been advised to discontinue this medication. He notes he has trouble affording colchicine. He has not made an appointment with his new primary care provider Dr. Ivan AnchorsHommel   Past Medical History  Diagnosis Date  . Hypertension   . Gout    No past surgical history on file. Social History  Substance Use Topics  . Smoking status: Current Every Day Smoker -- 0.25 packs/day    Types: Cigarettes  . Smokeless tobacco: Not on file  . Alcohol Use: No   family history is not on file.  ROS as above Medications: Current Outpatient Prescriptions  Medication Sig Dispense Refill  . allopurinol (ZYLOPRIM) 100 MG tablet Take 1 tablet (100 mg total) by mouth daily. 30 tablet 6  . HYDROcodone-acetaminophen (NORCO/VICODIN) 5-325 MG tablet Take 1 tablet by mouth every 6 (six) hours as needed. 15 tablet 0  . metoprolol (LOPRESSOR) 50 MG tablet One tablet bid 60 tablet 2  . predniSONE (STERAPRED UNI-PAK 48 TAB) 5 MG (48) TBPK tablet 12 day dosepack po 48 tablet 0   No current facility-administered medications for this visit.   No Known Allergies   Exam:  BP 173/105 mmHg  Pulse 52  Temp(Src) 98.2 F (36.8 C) (Oral)  Wt 178 lb (80.74 kg) Gen: Well NAD HEENT: EOMI,  MMM Lungs: Normal work of breathing. CTABL Heart: RRR no MRG Abd: NABS, Soft. Nondistended, Nontender Exts: Brisk capillary refill, warm and well perfused.  Left foot is erythematous tender along the midfoot. It is also swollen. Pulses capillary refill and sensation intact. Motion intact distally. Ankle is nontender  normal motion. No skin induration or fluctuance noted.  No results found for this or any previous visit (from the past 24 hour(s)). No results found.   Please see individual assessment and plan sections.

## 2015-05-14 ENCOUNTER — Ambulatory Visit: Payer: 59 | Admitting: Family Medicine

## 2015-05-14 ENCOUNTER — Encounter: Payer: Self-pay | Admitting: Family Medicine

## 2015-05-14 ENCOUNTER — Ambulatory Visit (INDEPENDENT_AMBULATORY_CARE_PROVIDER_SITE_OTHER): Payer: 59 | Admitting: Family Medicine

## 2015-05-14 VITALS — BP 134/90 | HR 53 | Wt 177.0 lb

## 2015-05-14 DIAGNOSIS — M10072 Idiopathic gout, left ankle and foot: Secondary | ICD-10-CM | POA: Diagnosis not present

## 2015-05-14 DIAGNOSIS — I10 Essential (primary) hypertension: Secondary | ICD-10-CM | POA: Diagnosis not present

## 2015-05-14 MED ORDER — METOPROLOL TARTRATE 75 MG PO TABS: ORAL_TABLET | ORAL | Status: DC

## 2015-05-14 NOTE — Progress Notes (Signed)
CC: Billy Beard is a 44 y.o. male is here for Hypertension and Gout   Subjective: HPI:   Follow-up essential hypertension: He is taking metoprolol 50 mg twice a day, he's seen that his blood pressures in the stage I hypertension range when he is checking this at home. He denies chest pain shortness of breath orthopnea nor peripheral edema. No known side effects  Follow-up gout: He is no longer having any pain in the left ankle or left hand. He is taking allopurinol as prescribed. Denies any swelling redness or joint pain.  Review Of Systems Outlined In HPI  Past Medical History  Diagnosis Date  . Hypertension   . Gout     No past surgical history on file. No family history on file.  Social History   Social History  . Marital Status: Single    Spouse Name: N/A  . Number of Children: N/A  . Years of Education: N/A   Occupational History  . Not on file.   Social History Main Topics  . Smoking status: Current Every Day Smoker -- 0.25 packs/day    Types: Cigarettes  . Smokeless tobacco: Not on file  . Alcohol Use: No  . Drug Use: No  . Sexual Activity: Not on file   Other Topics Concern  . Not on file   Social History Narrative     Objective: BP 134/90 mmHg  Pulse 53  Wt 177 lb (80.287 kg)  General: Alert and Oriented, No Acute Distress HEENT: Pupils equal, round, reactive to light. Conjunctivae clear.   Lungs: Clear to auscultation bilaterally, no wheezing/ronchi/rales.  Comfortable work of breathing. Good air movement. Cardiac: Regular rate and rhythm. Normal S1/S2.  No murmurs, rubs, nor gallops.   Extremities: No peripheral edema.  Strong peripheral pulses.  Mental Status: No depression, anxiety, nor agitation. Skin: Warm and dry.  Assessment & Plan: Billy Beard was seen today for hypertension and gout.  Diagnoses and all orders for this visit:  Essential hypertension -     metoprolol 75 MG TABS; One tablet bid  Acute idiopathic gout of left  foot   Essential hypertension: Uncontrolled chronic condition increasing dose of metoprolol Gout: Controlled continue allopurinol.  Return in about 3 months (around 08/12/2015).

## 2015-08-06 ENCOUNTER — Ambulatory Visit (INDEPENDENT_AMBULATORY_CARE_PROVIDER_SITE_OTHER): Payer: 59 | Admitting: Family Medicine

## 2015-08-06 ENCOUNTER — Encounter: Payer: Self-pay | Admitting: Family Medicine

## 2015-08-06 ENCOUNTER — Ambulatory Visit: Payer: 59 | Admitting: Family Medicine

## 2015-08-06 VITALS — BP 172/106 | HR 59 | Wt 184.0 lb

## 2015-08-06 DIAGNOSIS — M1A0720 Idiopathic chronic gout, left ankle and foot, without tophus (tophi): Secondary | ICD-10-CM

## 2015-08-06 DIAGNOSIS — M109 Gout, unspecified: Secondary | ICD-10-CM | POA: Diagnosis not present

## 2015-08-06 DIAGNOSIS — I10 Essential (primary) hypertension: Secondary | ICD-10-CM | POA: Diagnosis not present

## 2015-08-06 MED ORDER — METOPROLOL TARTRATE 75 MG PO TABS: 75.0000 mg | ORAL_TABLET | Freq: Every day | ORAL | Status: DC

## 2015-08-06 MED ORDER — ALLOPURINOL 100 MG PO TABS: 100.0000 mg | ORAL_TABLET | Freq: Every day | ORAL | Status: DC

## 2015-08-06 NOTE — Progress Notes (Signed)
CC: Billy Beard is a 45 y.o. male is here for Medication Refill and Medication Management   Subjective: HPI:   Follow-up essential hypertension: Since I saw him last he ran out of metoprolol 75 mg twice a day and start taking 50 mg twice a day. He then became confused about which dose he should actually be taking he decided to stop taking metoprolol a few days ago. Denies chest pain shortness of breath orthopnea nor peripheral edema. No outside blood pressures to report.  Since I saw him last he is not had any new joint pain swelling redness or warmth. He is pretty happy with the affect of allopurinol in his [name]. He would like a refill on this medication if possible.  Review Of Systems Outlined In HPI  Past Medical History  Diagnosis Date  . Hypertension   . Gout     No past surgical history on file. No family history on file.  Social History   Social History  . Marital Status: Single    Spouse Name: N/A  . Number of Children: N/A  . Years of Education: N/A   Occupational History  . Not on file.   Social History Main Topics  . Smoking status: Current Every Day Smoker -- 0.25 packs/day    Types: Cigarettes  . Smokeless tobacco: Not on file  . Alcohol Use: No  . Drug Use: No  . Sexual Activity: Not on file   Other Topics Concern  . Not on file   Social History Narrative     Objective: BP 172/106 mmHg  Pulse 59  Wt 184 lb (83.462 kg)  General: Alert and Oriented, No Acute Distress HEENT: Pupils equal, round, reactive to light. Conjunctivae clear. Moist mucous membranes Lungs: Clear to auscultation bilaterally, no wheezing/ronchi/rales.  Comfortable work of breathing. Good air movement. Cardiac: Regular rate and rhythm. Normal S1/S2.  No murmurs, rubs, nor gallops.   Extremities: No peripheral edema.  Strong peripheral pulses.  Mental Status: No depression, anxiety, nor agitation. Skin: Warm and dry.  Assessment & Plan: Billy Beard was seen today for medication  refill and medication management.  Diagnoses and all orders for this visit:  Essential hypertension -     Metoprolol Tartrate 75 MG TABS; Take 75 mg by mouth daily. One tablet bid  Gout without tophus, unspecified cause, unspecified chronicity, unspecified site  Idiopathic chronic gout of left ankle without tophus -     allopurinol (ZYLOPRIM) 100 MG tablet; Take 1 tablet (100 mg total) by mouth daily.   HTN: Uncontrolled, restart 75mg  formulation of metoprolol BID Gout: Controlled with allopurinol  Return in about 3 months (around 11/06/2015).

## 2015-08-17 ENCOUNTER — Ambulatory Visit (INDEPENDENT_AMBULATORY_CARE_PROVIDER_SITE_OTHER): Payer: 59 | Admitting: Family Medicine

## 2015-08-17 ENCOUNTER — Encounter: Payer: Self-pay | Admitting: Family Medicine

## 2015-08-17 DIAGNOSIS — Z0289 Encounter for other administrative examinations: Secondary | ICD-10-CM

## 2015-08-17 NOTE — Progress Notes (Addendum)
15 minutes late, no charge but counts as 1 of 3 no shows

## 2015-08-17 NOTE — Addendum Note (Signed)
Addended by: Laren BoomHOMMEL, Mindie Rawdon on: 08/17/2015 03:21 PM   Modules accepted: Level of Service

## 2015-08-20 ENCOUNTER — Ambulatory Visit (INDEPENDENT_AMBULATORY_CARE_PROVIDER_SITE_OTHER): Payer: 59 | Admitting: Family Medicine

## 2015-08-20 ENCOUNTER — Encounter: Payer: Self-pay | Admitting: Family Medicine

## 2015-08-20 ENCOUNTER — Other Ambulatory Visit: Payer: Self-pay | Admitting: Family Medicine

## 2015-08-20 VITALS — BP 148/93 | HR 49 | Wt 184.0 lb

## 2015-08-20 DIAGNOSIS — Z0000 Encounter for general adult medical examination without abnormal findings: Secondary | ICD-10-CM

## 2015-08-20 DIAGNOSIS — I10 Essential (primary) hypertension: Secondary | ICD-10-CM

## 2015-08-20 MED ORDER — METOPROLOL TARTRATE 75 MG PO TABS: 75.0000 mg | ORAL_TABLET | Freq: Two times a day (BID) | ORAL | Status: DC

## 2015-08-20 NOTE — Progress Notes (Signed)
CC: Billy Beard is a 45 y.o. male is here for Annual Exam   Subjective: HPI:  Colonoscopy: No current indication Prostate: Discussed screening risks/beneifts with patient today, no current indication for screening  Influenza Vaccine: Up-to-date Pneumovax: No current indication Td/Tdap: Up-to-date Zoster: (Start 43 yo)  Requesting completely physical exam with his only complaint being sharp chest pain at the sternum that comes on and lasts only seconds and goes away without any intervention. Never with exertion, never radiating.  Review of Systems - General ROS: negative for - chills, fever, night sweats, weight gain or weight loss Ophthalmic ROS: negative for - decreased vision Psychological ROS: negative for - anxiety or depression ENT ROS: negative for - hearing change, nasal congestion, tinnitus or allergies Hematological and Lymphatic ROS: negative for - bleeding problems, bruising or swollen lymph nodes Breast ROS: negative Respiratory ROS: no cough, shortness of breath, or wheezing Cardiovascular ROS: no dyspnea on exertion Gastrointestinal ROS: no abdominal pain, change in bowel habits, or black or bloody stools Genito-Urinary ROS: negative for - genital discharge, genital ulcers, incontinence or abnormal bleeding from genitals Musculoskeletal ROS: negative for - joint pain or muscle pain Neurological ROS: negative for - headaches or memory loss Dermatological ROS: negative for lumps, mole changes, rash and skin lesion changes  Past Medical History  Diagnosis Date  . Hypertension   . Gout     No past surgical history on file. No family history on file.  Social History   Social History  . Marital Status: Single    Spouse Name: N/A  . Number of Children: N/A  . Years of Education: N/A   Occupational History  . Not on file.   Social History Main Topics  . Smoking status: Current Every Day Smoker -- 0.25 packs/day    Types: Cigarettes  . Smokeless tobacco: Not  on file  . Alcohol Use: No  . Drug Use: No  . Sexual Activity: Not on file   Other Topics Concern  . Not on file   Social History Narrative     Objective: BP 148/93 mmHg  Pulse 49  Wt 184 lb (83.462 kg)  General: No Acute Distress HEENT: Atraumatic, normocephalic, conjunctivae normal without scleral icterus.  No nasal discharge, hearing grossly intact, TMs with good landmarks bilaterally with no middle ear abnormalities, posterior pharynx clear without oral lesions. Neck: Supple, trachea midline, no cervical nor supraclavicular adenopathy. Pulmonary: Clear to auscultation bilaterally without wheezing, rhonchi, nor rales. Cardiac: Regular rate and rhythm.  No murmurs, rubs, nor gallops. No peripheral edema.  2+ peripheral pulses bilaterally. Abdomen: Bowel sounds normal.  No masses.  Non-tender without rebound.  Negative Murphy's sign. MSK: Grossly intact, no signs of weakness.  Full strength throughout upper and lower extremities.  Full ROM in upper and lower extremities.  No midline spinal tenderness. Neuro: Gait unremarkable, CN II-XII grossly intact.  C5-C6 Reflex 2/4 Bilaterally, L4 Reflex 2/4 Bilaterally.  Cerebellar function intact. Skin: No rashes. Psych: Alert and oriented to person/place/time.  Thought process normal. No anxiety/depression.  Assessment & Plan: Billy Beard was seen today for annual exam.  Diagnoses and all orders for this visit:  Annual physical exam -     Lipid panel -     COMPLETE METABOLIC PANEL WITH GFR -     CBC  Essential hypertension -     Metoprolol Tartrate 75 MG TABS; Take 75 mg by mouth 2 (two) times daily.   Healthy lifestyle interventions including but not limited to regular exercise, a  healthy low fat diet, moderation of salt intake, the dangers of tobacco/alcohol/recreational drug use, nutrition supplementation, and accident avoidance were discussed with the patient and a handout was provided for future reference. Chest pains felt to be due  to costochondritis low suspicion for cardiac etiology.  No Follow-up on file.

## 2015-08-21 HISTORY — PX: THYROIDECTOMY: SHX17

## 2015-08-21 LAB — CBC
HEMATOCRIT: 44 % (ref 39.0–52.0)
HEMOGLOBIN: 14.5 g/dL (ref 13.0–17.0)
MCH: 27 pg (ref 26.0–34.0)
MCHC: 33 g/dL (ref 30.0–36.0)
MCV: 81.8 fL (ref 78.0–100.0)
MPV: 10.8 fL (ref 8.6–12.4)
PLATELETS: 216 10*3/uL (ref 150–400)
RBC: 5.38 MIL/uL (ref 4.22–5.81)
RDW: 13.3 % (ref 11.5–15.5)
WBC: 6.3 10*3/uL (ref 4.0–10.5)

## 2015-08-21 LAB — COMPLETE METABOLIC PANEL WITH GFR
ALT: 124 U/L — ABNORMAL HIGH (ref 9–46)
AST: 90 U/L — ABNORMAL HIGH (ref 10–40)
Albumin: 4 g/dL (ref 3.6–5.1)
Alkaline Phosphatase: 85 U/L (ref 40–115)
BUN: 17 mg/dL (ref 7–25)
CHLORIDE: 106 mmol/L (ref 98–110)
CO2: 24 mmol/L (ref 20–31)
Calcium: 9.3 mg/dL (ref 8.6–10.3)
Creat: 0.86 mg/dL (ref 0.60–1.35)
GLUCOSE: 141 mg/dL — AB (ref 65–99)
POTASSIUM: 4.3 mmol/L (ref 3.5–5.3)
SODIUM: 138 mmol/L (ref 135–146)
TOTAL PROTEIN: 6.6 g/dL (ref 6.1–8.1)
Total Bilirubin: 0.3 mg/dL (ref 0.2–1.2)

## 2015-08-21 LAB — LIPID PANEL
Cholesterol: 202 mg/dL — ABNORMAL HIGH (ref 125–200)
HDL: 29 mg/dL — AB (ref >=40)
TRIGLYCERIDES: 623 mg/dL — AB (ref <150)
Total CHOL/HDL Ratio: 7 Ratio — ABNORMAL HIGH (ref <=5.0)

## 2015-08-23 ENCOUNTER — Encounter: Payer: Self-pay | Admitting: Family Medicine

## 2015-08-23 ENCOUNTER — Telehealth: Payer: Self-pay | Admitting: Family Medicine

## 2015-08-23 DIAGNOSIS — R7303 Prediabetes: Secondary | ICD-10-CM | POA: Insufficient documentation

## 2015-08-23 DIAGNOSIS — E781 Pure hyperglyceridemia: Secondary | ICD-10-CM | POA: Insufficient documentation

## 2015-08-23 DIAGNOSIS — R739 Hyperglycemia, unspecified: Secondary | ICD-10-CM

## 2015-08-23 LAB — HEMOGLOBIN A1C
HEMOGLOBIN A1C: 6.1 % — AB (ref <5.7)
Mean Plasma Glucose: 128 mg/dL

## 2015-08-23 MED ORDER — ICOSAPENT ETHYL 1 G PO CAPS: ORAL_CAPSULE | ORAL | Status: DC

## 2015-08-23 NOTE — Telephone Encounter (Signed)
Pt.notified

## 2015-08-23 NOTE — Telephone Encounter (Signed)
Will you please let patient know that his triglycerides were elevated to a degree that can potentially cause liver or pancreatic inflammation and I'd recommend starting a pharmaceutical grade fish oil that I've sent to the [facility] in [place].  We have a savings voucher that I'd recommend he pick up here before getting the Rx. Also his blood sugar was moderately elevated and I'd recommend he have an A1c checked, I'll print off a lab slip just in case the lab is unable to add this on to his blood work from Friday.

## 2015-09-08 ENCOUNTER — Inpatient Hospital Stay (HOSPITAL_BASED_OUTPATIENT_CLINIC_OR_DEPARTMENT_OTHER)
Admission: EM | Admit: 2015-09-08 | Discharge: 2015-09-11 | DRG: 202 | Disposition: A | Discharge status: Home / Self Care | Payer: 59 | Attending: Internal Medicine | Admitting: Internal Medicine

## 2015-09-08 ENCOUNTER — Encounter (HOSPITAL_BASED_OUTPATIENT_CLINIC_OR_DEPARTMENT_OTHER): Payer: Self-pay | Admitting: *Deleted

## 2015-09-08 ENCOUNTER — Emergency Department (HOSPITAL_BASED_OUTPATIENT_CLINIC_OR_DEPARTMENT_OTHER): Payer: 59

## 2015-09-08 DIAGNOSIS — R609 Edema, unspecified: Secondary | ICD-10-CM

## 2015-09-08 DIAGNOSIS — Z6832 Body mass index (BMI) 32.0-32.9, adult: Secondary | ICD-10-CM

## 2015-09-08 DIAGNOSIS — J9801 Acute bronchospasm: Secondary | ICD-10-CM | POA: Diagnosis present

## 2015-09-08 DIAGNOSIS — J209 Acute bronchitis, unspecified: Principal | ICD-10-CM | POA: Diagnosis present

## 2015-09-08 DIAGNOSIS — A419 Sepsis, unspecified organism: Secondary | ICD-10-CM | POA: Diagnosis present

## 2015-09-08 DIAGNOSIS — I5031 Acute diastolic (congestive) heart failure: Secondary | ICD-10-CM

## 2015-09-08 DIAGNOSIS — I4891 Unspecified atrial fibrillation: Secondary | ICD-10-CM

## 2015-09-08 DIAGNOSIS — E669 Obesity, unspecified: Secondary | ICD-10-CM | POA: Diagnosis present

## 2015-09-08 DIAGNOSIS — F17200 Nicotine dependence, unspecified, uncomplicated: Secondary | ICD-10-CM | POA: Diagnosis present

## 2015-09-08 DIAGNOSIS — M109 Gout, unspecified: Secondary | ICD-10-CM | POA: Diagnosis present

## 2015-09-08 DIAGNOSIS — I4892 Unspecified atrial flutter: Secondary | ICD-10-CM | POA: Diagnosis present

## 2015-09-08 DIAGNOSIS — R7303 Prediabetes: Secondary | ICD-10-CM | POA: Diagnosis present

## 2015-09-08 DIAGNOSIS — R05 Cough: Secondary | ICD-10-CM | POA: Diagnosis not present

## 2015-09-08 DIAGNOSIS — Z8249 Family history of ischemic heart disease and other diseases of the circulatory system: Secondary | ICD-10-CM

## 2015-09-08 DIAGNOSIS — F17210 Nicotine dependence, cigarettes, uncomplicated: Secondary | ICD-10-CM | POA: Diagnosis present

## 2015-09-08 DIAGNOSIS — J45901 Unspecified asthma with (acute) exacerbation: Secondary | ICD-10-CM

## 2015-09-08 DIAGNOSIS — I10 Essential (primary) hypertension: Secondary | ICD-10-CM | POA: Diagnosis present

## 2015-09-08 DIAGNOSIS — R079 Chest pain, unspecified: Secondary | ICD-10-CM | POA: Diagnosis present

## 2015-09-08 DIAGNOSIS — I480 Paroxysmal atrial fibrillation: Secondary | ICD-10-CM | POA: Diagnosis present

## 2015-09-08 DIAGNOSIS — I110 Hypertensive heart disease with heart failure: Secondary | ICD-10-CM | POA: Diagnosis present

## 2015-09-08 DIAGNOSIS — M1A9XX0 Chronic gout, unspecified, without tophus (tophi): Secondary | ICD-10-CM | POA: Diagnosis present

## 2015-09-08 LAB — CBC WITH DIFFERENTIAL/PLATELET
Basophils Absolute: 0.1 10*3/uL (ref 0.0–0.1)
Basophils Relative: 1 %
Eosinophils Absolute: 0.8 10*3/uL — ABNORMAL HIGH (ref 0.0–0.7)
Eosinophils Relative: 9 %
HEMATOCRIT: 45.7 % (ref 39.0–52.0)
HEMOGLOBIN: 15.3 g/dL (ref 13.0–17.0)
LYMPHS ABS: 3.1 10*3/uL (ref 0.7–4.0)
Lymphocytes Relative: 35 %
MCH: 27.4 pg (ref 26.0–34.0)
MCHC: 33.5 g/dL (ref 30.0–36.0)
MCV: 81.9 fL (ref 78.0–100.0)
MONOS PCT: 10 %
Monocytes Absolute: 0.9 10*3/uL (ref 0.1–1.0)
NEUTROS ABS: 4 10*3/uL (ref 1.7–7.7)
NEUTROS PCT: 45 %
Platelets: 212 10*3/uL (ref 150–400)
RBC: 5.58 MIL/uL (ref 4.22–5.81)
RDW: 13.2 % (ref 11.5–15.5)
WBC: 8.9 10*3/uL (ref 4.0–10.5)

## 2015-09-08 LAB — BASIC METABOLIC PANEL
Anion gap: 8 (ref 5–15)
BUN: 16 mg/dL (ref 6–20)
CHLORIDE: 105 mmol/L (ref 101–111)
CO2: 25 mmol/L (ref 22–32)
CREATININE: 0.89 mg/dL (ref 0.61–1.24)
Calcium: 9.8 mg/dL (ref 8.9–10.3)
GFR calc Af Amer: >60 mL/min (ref >60)
GFR calc non Af Amer: >60 mL/min (ref >60)
GLUCOSE: 113 mg/dL — AB (ref 65–99)
Potassium: 4 mmol/L (ref 3.5–5.1)
Sodium: 138 mmol/L (ref 135–145)

## 2015-09-08 LAB — BRAIN NATRIURETIC PEPTIDE: B Natriuretic Peptide: 10.5 pg/mL (ref 0.0–100.0)

## 2015-09-08 LAB — TROPONIN I: Troponin I: <0.03 ng/mL (ref <0.031)

## 2015-09-08 MED ORDER — METHYLPREDNISOLONE SODIUM SUCC 125 MG IJ SOLR
INTRAMUSCULAR | Status: AC
  Filled 2015-09-08: qty 2

## 2015-09-08 MED ORDER — ALBUTEROL (5 MG/ML) CONTINUOUS INHALATION SOLN
10.0000 mg/h | INHALATION_SOLUTION | RESPIRATORY_TRACT | Status: AC
  Administered 2015-09-08: 10 mg/h via RESPIRATORY_TRACT
  Filled 2015-09-08: qty 20

## 2015-09-08 MED ORDER — SODIUM CHLORIDE 0.9 % IV BOLUS (SEPSIS)
500.0000 mL | Freq: Once | INTRAVENOUS | Status: AC
  Administered 2015-09-08: 500 mL via INTRAVENOUS

## 2015-09-08 MED ORDER — ALBUTEROL (5 MG/ML) CONTINUOUS INHALATION SOLN
10.0000 mg/h | INHALATION_SOLUTION | RESPIRATORY_TRACT | Status: DC
  Administered 2015-09-08: 10 mg/h via RESPIRATORY_TRACT

## 2015-09-08 MED ORDER — ALBUTEROL SULFATE (2.5 MG/3ML) 0.083% IN NEBU
2.5000 mg | INHALATION_SOLUTION | Freq: Once | RESPIRATORY_TRACT | Status: AC
  Administered 2015-09-08: 2.5 mg via RESPIRATORY_TRACT
  Filled 2015-09-08: qty 3

## 2015-09-08 MED ORDER — METHYLPREDNISOLONE SODIUM SUCC 125 MG IJ SOLR
125.0000 mg | Freq: Once | INTRAMUSCULAR | Status: AC
  Administered 2015-09-08: 125 mg via INTRAVENOUS

## 2015-09-08 MED ORDER — MAGNESIUM SULFATE 2 GM/50ML IV SOLN
INTRAVENOUS | Status: AC
  Administered 2015-09-08: 2 g via INTRAVENOUS
  Filled 2015-09-08: qty 50

## 2015-09-08 MED ORDER — IPRATROPIUM-ALBUTEROL 0.5-2.5 (3) MG/3ML IN SOLN
3.0000 mL | Freq: Once | RESPIRATORY_TRACT | Status: AC
  Administered 2015-09-08: 3 mL via RESPIRATORY_TRACT
  Filled 2015-09-08: qty 3

## 2015-09-08 MED ORDER — SODIUM CHLORIDE 0.9 % IV SOLN
INTRAVENOUS | Status: DC
  Administered 2015-09-08: 21:00:00 via INTRAVENOUS

## 2015-09-08 MED ORDER — SODIUM CHLORIDE 0.9 % IV SOLN: INTRAVENOUS | Status: DC

## 2015-09-08 MED ORDER — MAGNESIUM SULFATE 2 GM/50ML IV SOLN
2.0000 g | Freq: Once | INTRAVENOUS | Status: AC
  Administered 2015-09-08: 2 g via INTRAVENOUS

## 2015-09-08 NOTE — ED Provider Notes (Signed)
CSN: [id number]     Arrival date & time 09/08/15  2002 History  By signing my name below, I, Linna DarnerRussell Turner, attest that this documentation has been prepared under the direction and in the presence of physician practitioner, Vanetta MuldersScott Layton Naves, MD. Electronically Signed: Linna Darnerussell Turner, Scribe. 09/08/2015. 9:47 PM.      Chief Complaint  Patient presents with  . Shortness of Breath    Patient is a 45 y.o. male presenting with shortness of breath. The history is provided by the patient. No language interpreter was used.  Shortness of Breath Severity:  Severe Onset quality:  Sudden Duration:  4 hours Timing:  Constant Progression:  Improving Chronicity:  New Associated symptoms: chest pain, cough, headaches and wheezing   Associated symptoms: no abdominal pain, no fever, no rash, no sore throat and no vomiting   Chest pain:    Quality:  Unable to specify   Severity:  Moderate   Onset quality:  Sudden   Duration:  4 hours   Timing:  Unable to specify   Progression:  Unable to specify   Chronicity:  New Cough:    Cough characteristics:  Dry   Severity:  Severe   Onset quality:  Sudden   Timing:  Intermittent   Progression:  Improving   Chronicity:  New Headaches:    Severity:  Unable to specify   Onset quality:  Sudden   Duration:  4 hours   Timing:  Unable to specify   Progression:  Improving   Chronicity:  New Wheezing:    Severity:  Severe   Onset quality:  Sudden   Timing:  Constant   Progression:  Improving   Chronicity:  New Risk factors: tobacco use     HPI Comments: Marin OlpSunil Britain is a 45 y.o. male with h/o HTN who presents to the Emergency Department complaining of sudden onset, constant, severe, improving, SOB and wheezing beginning around 6 PM this evening. Pt also complains of significant dry cough and chest pain. Pt is currently on his second continuous Duo neb and has received Solumedrol and magnesium sulfate since arrival to the ER; he states that he feels  significantly better than he did upon arrival to the ER. Pt experienced a severe coughing episode when he arrived home from work today; he notes that he does not work outside and does not work around dust or chemicals. Pt reports that he has experienced allergies to pollen recently; he is not new to the area. Pt denies sore throat, nausea, vomiting, or any other associated symptoms.  Past Medical History  Diagnosis Date  . Hypertension   . Gout    History reviewed. No pertinent past surgical history. History reviewed. No pertinent family history. Social History  Substance Use Topics  . Smoking status: Current Every Day Smoker -- 0.25 packs/day    Types: Cigarettes  . Smokeless tobacco: None  . Alcohol Use: No    Review of Systems  Constitutional: Negative for fever and chills.  HENT: Positive for congestion. Negative for rhinorrhea and sore throat.   Eyes: Positive for itching. Negative for visual disturbance.  Respiratory: Positive for cough, shortness of breath and wheezing.   Cardiovascular: Positive for chest pain.  Gastrointestinal: Negative for nausea, vomiting, abdominal pain and diarrhea.  Genitourinary: Negative for dysuria.  Musculoskeletal: Negative for back pain and joint swelling.  Skin: Negative for rash.  Neurological: Positive for headaches.  Hematological: Does not bruise/bleed easily.  Psychiatric/Behavioral: Negative for confusion.    Allergies  Review  of patient's allergies indicates no known allergies.  Home Medications   Prior to Admission medications   Medication Sig Start Date End Date Taking? Authorizing Provider  allopurinol (ZYLOPRIM) 100 MG tablet Take 1 tablet (100 mg total) by mouth daily. 08/06/15   Laren Boom, DO  Icosapent Ethyl (VASCEPA) 1 g CAPS Two by mouth twice a day taken at mealtime. 08/23/15   Laren Boom, DO  Metoprolol Tartrate 75 MG TABS Take 75 mg by mouth 2 (two) times daily. 08/20/15   Sean Hommel, DO   BP 139/105 mmHg  Pulse 62   Temp(Src) 98 F (36.7 C) (Oral)  Resp 14  SpO2 96% Physical Exam  Constitutional: He is oriented to person, place, and time. He appears well-developed and well-nourished. No distress.  HENT:  Head: Normocephalic and atraumatic.  Mouth/Throat: Mucous membranes are normal.  Eyes: Conjunctivae and EOM are normal. Pupils are equal, round, and reactive to light.  Sclera are red  Neck: Neck supple. No tracheal deviation present.  Cardiovascular: Normal rate, regular rhythm and normal heart sounds.   No murmur heard. Pulmonary/Chest: No respiratory distress. He has wheezes.  Moving air well  Abdominal: Soft. Bowel sounds are normal. There is no tenderness.  Musculoskeletal: Normal range of motion.       Right ankle: He exhibits no swelling.       Left ankle: He exhibits no swelling.  Neurological: He is alert and oriented to person, place, and time. He has normal reflexes. He displays normal reflexes. He exhibits normal muscle tone. Coordination normal.  Skin: Skin is warm and dry.  Psychiatric: He has a normal mood and affect. His behavior is normal.  Nursing note and vitals reviewed.   ED Course  Procedures (including critical care time)  DIAGNOSTIC STUDIES: Oxygen Saturation is 97% on RA, normal by my interpretation.    COORDINATION OF CARE: 9:47 PM Discussed treatment plan with pt at bedside and pt agreed to plan.  Medications  albuterol (PROVENTIL,VENTOLIN) solution continuous neb (0 mg/hr Nebulization Stopped 09/08/15 2116)  0.9 %  sodium chloride infusion ( Intravenous New Bag/Given 09/08/15 2038)  albuterol (PROVENTIL,VENTOLIN) solution continuous neb (10 mg/hr Nebulization New Bag/Given 09/08/15 2117)  0.9 %  sodium chloride infusion (not administered)  albuterol (PROVENTIL) (2.5 MG/3ML) 0.083% nebulizer solution 2.5 mg (2.5 mg Nebulization Given 09/08/15 2016)  ipratropium-albuterol (DUONEB) 0.5-2.5 (3) MG/3ML nebulizer solution 3 mL (3 mLs Nebulization Given 09/08/15 2016)   methylPREDNISolone sodium succinate (SOLU-MEDROL) 125 mg/2 mL injection 125 mg (125 mg Intravenous Given 09/08/15 2029)  sodium chloride 0.9 % bolus 500 mL (500 mLs Intravenous New Bag/Given 09/08/15 2030)  magnesium sulfate IVPB 2 g 50 mL (2 g Intravenous New Bag/Given 09/08/15 2037)   Results for orders placed or performed during the hospital encounter of 09/08/15  CBC with Differential/Platelet  Result Value Ref Range   WBC 8.9 4.0 - 10.5 K/uL   RBC 5.58 4.22 - 5.81 MIL/uL   Hemoglobin 15.3 13.0 - 17.0 g/dL   HCT 21.3 08.6 - 57.8 %   MCV 81.9 78.0 - 100.0 fL   MCH 27.4 26.0 - 34.0 pg   MCHC 33.5 30.0 - 36.0 g/dL   RDW 46.9 62.9 - 52.8 %   Platelets 212 150 - 400 K/uL   Neutrophils Relative % 45 %   Neutro Abs 4.0 1.7 - 7.7 K/uL   Lymphocytes Relative 35 %   Lymphs Abs 3.1 0.7 - 4.0 K/uL   Monocytes Relative 10 %   Monocytes Absolute  0.9 0.1 - 1.0 K/uL   Eosinophils Relative 9 %   Eosinophils Absolute 0.8 (H) 0.0 - 0.7 K/uL   Basophils Relative 1 %   Basophils Absolute 0.1 0.0 - 0.1 K/uL  Basic metabolic panel  Result Value Ref Range   Sodium 138 135 - 145 mmol/L   Potassium 4.0 3.5 - 5.1 mmol/L   Chloride 105 101 - 111 mmol/L   CO2 25 22 - 32 mmol/L   Glucose, Bld 113 (H) 65 - 99 mg/dL   BUN 16 6 - 20 mg/dL   Creatinine, Ser 1.61 0.61 - 1.24 mg/dL   Calcium 9.8 8.9 - 09.6 mg/dL   GFR calc non Af Amer >60 >60 mL/min   GFR calc Af Amer >60 >60 mL/min   Anion gap 8 5 - 15  Brain natriuretic peptide  Result Value Ref Range   B Natriuretic Peptide 10.5 0.0 - 100.0 pg/mL  Troponin I  Result Value Ref Range   Troponin I <0.03 <0.031 ng/mL   Dg Chest Port 1 View  09/08/2015  CLINICAL DATA:  Severe shortness of breath. EXAM: PORTABLE CHEST - 1 VIEW COMPARISON:  None. FINDINGS: The heart is mildly enlarged. This is exaggerated by low lung volumes. There is mild interstitial prominence. No effusions are present. There is no focal airspace disease. IMPRESSION: 1. Borderline  cardiomegaly and mild diffuse interstitial pattern suggesting mild edema. 2. No focal airspace disease or effusions. Electronically Signed   By: Marin Roberts M.D.   On: 09/08/2015 21:03      EKG Interpretation   Date/Time:  Wednesday September 08 2015 20:26:58 EDT Ventricular Rate:  70 PR Interval:  138 QRS Duration: 101 QT Interval:  368 QTC Calculation: 397 R Axis:   51 Text Interpretation:  Sinus rhythm Probable left atrial enlargement RSR'  in V1 or V2, probably normal variant Borderline T wave abnormalities ST  elevation, consider anterior injury No previous ECGs available Confirmed  by Michole Lecuyer  MD, Pau Banh [phone]) on 09/08/2015 8:51:32 PM       CRITICAL CARE Performed by: Vanetta Mulders Total critical care time: 30 minutes Critical care time was exclusive of separately billable procedures and treating other patients. Critical care was necessary to treat or prevent imminent or life-threatening deterioration. Critical care was time spent personally by me on the following activities: development of treatment plan with patient and/or surrogate as well as nursing, discussions with consultants, evaluation of patient's response to treatment, examination of patient, obtaining history from patient or surrogate, ordering and performing treatments and interventions, ordering and review of laboratory studies, ordering and review of radiographic studies, pulse oximetry and re-evaluation of patient's condition.     MDM   Final diagnoses:  Asthma attack     Patient with an acute bronchospasm attack no history of asthma or COPD. Patient also associated with the chest pain however troponins negative EKG without acute findings. Patient could not talk in complete sentences. Required continuous neb upon arrival to the ED. Patient required max sulfate and was also given Cipro Medrol. With that patient started to show signs of improvement and he started to get part of the history.  Patient's working on a second continuous neb however he is still wheezing but markedly improved. Troponin was negative chest x-ray without evidence of pneumonia pneumothorax or significant pulmonary edema. Patient's blood pressures have been high and I suspect that he probably has a history of hypertension currently on a beta blocker. But it may not be well controlled may need  adjustments. No leukocytosis. No anemia.  Discussed with the hospitalist at [facility] will admit the 2 telemetry observation and continue breathing treatments. Patient will be transported by [facility]. Intal a completed.   I personally performed the services described in this documentation, which was scribed in my presence. The recorded information has been reviewed and is accurate.     Vanetta Mulders, MD 09/08/15 2253

## 2015-09-08 NOTE — ED Notes (Addendum)
Pt c/o SOB x 1 day Wheezing noted in triage

## 2015-09-08 NOTE — ED Notes (Signed)
C/o sob and wheezing x 1 day

## 2015-09-09 ENCOUNTER — Observation Stay (HOSPITAL_COMMUNITY): Payer: 59

## 2015-09-09 ENCOUNTER — Encounter (HOSPITAL_COMMUNITY): Payer: Self-pay | Admitting: Internal Medicine

## 2015-09-09 DIAGNOSIS — I4892 Unspecified atrial flutter: Secondary | ICD-10-CM | POA: Diagnosis present

## 2015-09-09 DIAGNOSIS — J9801 Acute bronchospasm: Secondary | ICD-10-CM | POA: Diagnosis not present

## 2015-09-09 DIAGNOSIS — E669 Obesity, unspecified: Secondary | ICD-10-CM | POA: Diagnosis present

## 2015-09-09 DIAGNOSIS — F17210 Nicotine dependence, cigarettes, uncomplicated: Secondary | ICD-10-CM | POA: Diagnosis present

## 2015-09-09 DIAGNOSIS — R609 Edema, unspecified: Secondary | ICD-10-CM

## 2015-09-09 DIAGNOSIS — Z720 Tobacco use: Secondary | ICD-10-CM

## 2015-09-09 DIAGNOSIS — J209 Acute bronchitis, unspecified: Secondary | ICD-10-CM | POA: Diagnosis present

## 2015-09-09 DIAGNOSIS — R7303 Prediabetes: Secondary | ICD-10-CM | POA: Diagnosis present

## 2015-09-09 DIAGNOSIS — I5031 Acute diastolic (congestive) heart failure: Secondary | ICD-10-CM | POA: Diagnosis present

## 2015-09-09 DIAGNOSIS — R079 Chest pain, unspecified: Secondary | ICD-10-CM | POA: Diagnosis present

## 2015-09-09 DIAGNOSIS — Z6832 Body mass index (BMI) 32.0-32.9, adult: Secondary | ICD-10-CM | POA: Diagnosis not present

## 2015-09-09 DIAGNOSIS — I110 Hypertensive heart disease with heart failure: Secondary | ICD-10-CM | POA: Diagnosis present

## 2015-09-09 DIAGNOSIS — I480 Paroxysmal atrial fibrillation: Secondary | ICD-10-CM | POA: Diagnosis present

## 2015-09-09 DIAGNOSIS — A419 Sepsis, unspecified organism: Secondary | ICD-10-CM | POA: Diagnosis present

## 2015-09-09 DIAGNOSIS — I4891 Unspecified atrial fibrillation: Secondary | ICD-10-CM | POA: Diagnosis not present

## 2015-09-09 DIAGNOSIS — R05 Cough: Secondary | ICD-10-CM | POA: Diagnosis present

## 2015-09-09 DIAGNOSIS — M1A9XX0 Chronic gout, unspecified, without tophus (tophi): Secondary | ICD-10-CM | POA: Diagnosis present

## 2015-09-09 DIAGNOSIS — Z8249 Family history of ischemic heart disease and other diseases of the circulatory system: Secondary | ICD-10-CM | POA: Diagnosis not present

## 2015-09-09 DIAGNOSIS — I10 Essential (primary) hypertension: Secondary | ICD-10-CM | POA: Diagnosis not present

## 2015-09-09 LAB — CBC
HCT: 43.1 % (ref 39.0–52.0)
HEMOGLOBIN: 13.9 g/dL (ref 13.0–17.0)
MCH: 26.3 pg (ref 26.0–34.0)
MCHC: 32.3 g/dL (ref 30.0–36.0)
MCV: 81.6 fL (ref 78.0–100.0)
Platelets: 214 10*3/uL (ref 150–400)
RBC: 5.28 MIL/uL (ref 4.22–5.81)
RDW: 12.9 % (ref 11.5–15.5)
WBC: 9.8 10*3/uL (ref 4.0–10.5)

## 2015-09-09 LAB — COMPREHENSIVE METABOLIC PANEL
ALT: 109 U/L — ABNORMAL HIGH (ref 17–63)
ANION GAP: 13 (ref 5–15)
AST: 67 U/L — ABNORMAL HIGH (ref 15–41)
Albumin: 3.3 g/dL — ABNORMAL LOW (ref 3.5–5.0)
Alkaline Phosphatase: 73 U/L (ref 38–126)
BUN: 12 mg/dL (ref 6–20)
CHLORIDE: 107 mmol/L (ref 101–111)
CO2: 19 mmol/L — AB (ref 22–32)
Calcium: 8.9 mg/dL (ref 8.9–10.3)
Creatinine, Ser: 0.89 mg/dL (ref 0.61–1.24)
Glucose, Bld: 160 mg/dL — ABNORMAL HIGH (ref 65–99)
Potassium: 4.5 mmol/L (ref 3.5–5.1)
SODIUM: 139 mmol/L (ref 135–145)
Total Bilirubin: 0.6 mg/dL (ref 0.3–1.2)
Total Protein: 6.8 g/dL (ref 6.5–8.1)

## 2015-09-09 LAB — TSH: TSH: 0.519 u[IU]/mL (ref 0.350–4.500)

## 2015-09-09 LAB — HIV ANTIBODY (ROUTINE TESTING W REFLEX): HIV Screen 4th Generation wRfx: NONREACTIVE

## 2015-09-09 LAB — RAPID URINE DRUG SCREEN, HOSP PERFORMED
AMPHETAMINES: NOT DETECTED
Barbiturates: NOT DETECTED
Benzodiazepines: NOT DETECTED
COCAINE: NOT DETECTED
OPIATES: NOT DETECTED
TETRAHYDROCANNABINOL: NOT DETECTED

## 2015-09-09 LAB — TROPONIN I: Troponin I: <0.03 ng/mL (ref <0.031)

## 2015-09-09 LAB — LIPID PANEL
Cholesterol: 206 mg/dL — ABNORMAL HIGH (ref 0–200)
HDL: 43 mg/dL (ref >40)
LDL CALC: 140 mg/dL — AB (ref 0–99)
Total CHOL/HDL Ratio: 4.8 RATIO
Triglycerides: 116 mg/dL (ref <150)
VLDL: 23 mg/dL (ref 0–40)

## 2015-09-09 LAB — APTT: APTT: 31 s (ref 24–37)

## 2015-09-09 LAB — INFLUENZA PANEL BY PCR (TYPE A & B)
H1N1FLUPCR: NOT DETECTED
Influenza A By PCR: NEGATIVE
Influenza B By PCR: NEGATIVE

## 2015-09-09 LAB — STREP PNEUMONIAE URINARY ANTIGEN: STREP PNEUMO URINARY ANTIGEN: NEGATIVE

## 2015-09-09 LAB — LACTIC ACID, PLASMA
LACTIC ACID, VENOUS: 6 mmol/L — AB (ref 0.5–2.0)
Lactic Acid, Venous: 6.5 mmol/L (ref 0.5–2.0)

## 2015-09-09 LAB — PROTIME-INR
INR: 1.05 (ref 0.00–1.49)
PROTHROMBIN TIME: 13.9 s (ref 11.6–15.2)

## 2015-09-09 LAB — D-DIMER, QUANTITATIVE (NOT AT ARMC)

## 2015-09-09 LAB — T4, FREE: Free T4: 0.92 ng/dL (ref 0.61–1.12)

## 2015-09-09 LAB — PROCALCITONIN: Procalcitonin: <0.1 ng/mL

## 2015-09-09 MED ORDER — PANTOPRAZOLE SODIUM 40 MG PO TBEC
40.0000 mg | DELAYED_RELEASE_TABLET | Freq: Every day | ORAL | Status: DC
  Administered 2015-09-09 – 2015-09-11 (×3): 40 mg via ORAL
  Filled 2015-09-09 (×4): qty 1

## 2015-09-09 MED ORDER — LORATADINE 10 MG PO TABS
10.0000 mg | ORAL_TABLET | Freq: Every day | ORAL | Status: DC
  Administered 2015-09-09 – 2015-09-11 (×3): 10 mg via ORAL
  Filled 2015-09-09 (×3): qty 1

## 2015-09-09 MED ORDER — FLUTICASONE PROPIONATE 50 MCG/ACT NA SUSP
2.0000 | Freq: Every day | NASAL | Status: DC
  Administered 2015-09-09 – 2015-09-11 (×3): 2 via NASAL
  Filled 2015-09-09: qty 16

## 2015-09-09 MED ORDER — ACETAMINOPHEN 650 MG RE SUPP: 650.0000 mg | Freq: Four times a day (QID) | RECTAL | Status: DC | PRN

## 2015-09-09 MED ORDER — ALUM & MAG HYDROXIDE-SIMETH 200-200-20 MG/5ML PO SUSP: 30.0000 mL | Freq: Four times a day (QID) | ORAL | Status: DC | PRN

## 2015-09-09 MED ORDER — METOPROLOL TARTRATE 75 MG PO TABS: 75.0000 mg | ORAL_TABLET | Freq: Two times a day (BID) | ORAL | Status: DC

## 2015-09-09 MED ORDER — DILTIAZEM HCL 60 MG PO TABS: 60.0000 mg | ORAL_TABLET | Freq: Three times a day (TID) | ORAL | Status: DC

## 2015-09-09 MED ORDER — METOPROLOL TARTRATE 100 MG PO TABS
100.0000 mg | ORAL_TABLET | Freq: Two times a day (BID) | ORAL | Status: DC
  Administered 2015-09-09 – 2015-09-10 (×2): 100 mg via ORAL
  Filled 2015-09-09 (×2): qty 1

## 2015-09-09 MED ORDER — METOPROLOL TARTRATE 1 MG/ML IV SOLN: 5.0000 mg | Freq: Once | INTRAVENOUS | Status: DC

## 2015-09-09 MED ORDER — ACETAMINOPHEN 325 MG PO TABS: 650.0000 mg | ORAL_TABLET | Freq: Four times a day (QID) | ORAL | Status: DC | PRN

## 2015-09-09 MED ORDER — IPRATROPIUM BROMIDE 0.02 % IN SOLN
0.5000 mg | RESPIRATORY_TRACT | Status: DC
  Administered 2015-09-09: 0.5 mg via RESPIRATORY_TRACT
  Filled 2015-09-09: qty 2.5

## 2015-09-09 MED ORDER — AZITHROMYCIN 500 MG PO TABS
500.0000 mg | ORAL_TABLET | Freq: Every day | ORAL | Status: AC
  Administered 2015-09-09: 500 mg via ORAL
  Filled 2015-09-09: qty 1

## 2015-09-09 MED ORDER — METHYLPREDNISOLONE SODIUM SUCC 40 MG IJ SOLR
40.0000 mg | Freq: Two times a day (BID) | INTRAMUSCULAR | Status: DC
  Administered 2015-09-09 – 2015-09-10 (×2): 40 mg via INTRAVENOUS
  Filled 2015-09-09 (×2): qty 1

## 2015-09-09 MED ORDER — HEPARIN (PORCINE) IN NACL 100-0.45 UNIT/ML-% IJ SOLN
1150.0000 [IU]/h | INTRAMUSCULAR | Status: DC
  Administered 2015-09-09: 1300 [IU]/h via INTRAVENOUS
  Filled 2015-09-09: qty 250

## 2015-09-09 MED ORDER — NICOTINE 21 MG/24HR TD PT24
21.0000 mg | MEDICATED_PATCH | Freq: Every day | TRANSDERMAL | Status: DC
  Administered 2015-09-09 – 2015-09-11 (×3): 21 mg via TRANSDERMAL
  Filled 2015-09-09 (×3): qty 1

## 2015-09-09 MED ORDER — ASPIRIN EC 325 MG PO TBEC
325.0000 mg | DELAYED_RELEASE_TABLET | Freq: Every day | ORAL | Status: DC
  Administered 2015-09-09: 325 mg via ORAL
  Filled 2015-09-09 (×2): qty 1

## 2015-09-09 MED ORDER — DM-GUAIFENESIN ER 30-600 MG PO TB12
1.0000 | ORAL_TABLET | Freq: Two times a day (BID) | ORAL | Status: DC
  Administered 2015-09-09 – 2015-09-11 (×6): 1 via ORAL
  Filled 2015-09-09 (×6): qty 1

## 2015-09-09 MED ORDER — BENZONATATE 100 MG PO CAPS
200.0000 mg | ORAL_CAPSULE | Freq: Three times a day (TID) | ORAL | Status: DC | PRN
  Administered 2015-09-09 – 2015-09-10 (×2): 200 mg via ORAL
  Filled 2015-09-09 (×2): qty 2

## 2015-09-09 MED ORDER — SODIUM CHLORIDE 0.9 % IV BOLUS (SEPSIS)
2500.0000 mL | Freq: Once | INTRAVENOUS | Status: AC
  Administered 2015-09-09: 2500 mL via INTRAVENOUS

## 2015-09-09 MED ORDER — DILTIAZEM LOAD VIA INFUSION
20.0000 mg | Freq: Once | INTRAVENOUS | Status: DC
  Filled 2015-09-09: qty 20

## 2015-09-09 MED ORDER — CEFTRIAXONE SODIUM 1 G IJ SOLR
1.0000 g | INTRAMUSCULAR | Status: DC
  Administered 2015-09-09: 1 g via INTRAVENOUS
  Filled 2015-09-09: qty 10

## 2015-09-09 MED ORDER — LEVALBUTEROL HCL 1.25 MG/0.5ML IN NEBU
1.2500 mg | INHALATION_SOLUTION | Freq: Four times a day (QID) | RESPIRATORY_TRACT | Status: DC
  Administered 2015-09-09: 1.25 mg via RESPIRATORY_TRACT
  Filled 2015-09-09: qty 0.5

## 2015-09-09 MED ORDER — ONDANSETRON HCL 4 MG PO TABS: 4.0000 mg | ORAL_TABLET | Freq: Four times a day (QID) | ORAL | Status: DC | PRN

## 2015-09-09 MED ORDER — OXYMETAZOLINE HCL 0.05 % NA SOLN
1.0000 | Freq: Two times a day (BID) | NASAL | Status: DC
  Administered 2015-09-09 – 2015-09-11 (×5): 1 via NASAL
  Filled 2015-09-09: qty 15

## 2015-09-09 MED ORDER — DILTIAZEM HCL 25 MG/5ML IV SOLN
20.0000 mg | Freq: Once | INTRAVENOUS | Status: DC
  Filled 2015-09-09: qty 5

## 2015-09-09 MED ORDER — METOPROLOL TARTRATE 50 MG PO TABS
75.0000 mg | ORAL_TABLET | Freq: Two times a day (BID) | ORAL | Status: DC
  Administered 2015-09-09 (×2): 75 mg via ORAL
  Filled 2015-09-09 (×2): qty 1

## 2015-09-09 MED ORDER — SODIUM CHLORIDE 0.9% FLUSH
3.0000 mL | Freq: Two times a day (BID) | INTRAVENOUS | Status: DC
  Administered 2015-09-09 – 2015-09-10 (×3): 3 mL via INTRAVENOUS

## 2015-09-09 MED ORDER — AZITHROMYCIN 500 MG PO TABS
250.0000 mg | ORAL_TABLET | Freq: Every day | ORAL | Status: AC
  Administered 2015-09-10 – 2015-09-11 (×2): 250 mg via ORAL
  Filled 2015-09-09 (×2): qty 1

## 2015-09-09 MED ORDER — NITROGLYCERIN 0.4 MG SL SUBL: 0.4000 mg | SUBLINGUAL_TABLET | SUBLINGUAL | Status: DC | PRN

## 2015-09-09 MED ORDER — IPRATROPIUM BROMIDE 0.02 % IN SOLN
0.5000 mg | Freq: Four times a day (QID) | RESPIRATORY_TRACT | Status: DC
  Administered 2015-09-09 – 2015-09-11 (×8): 0.5 mg via RESPIRATORY_TRACT
  Filled 2015-09-09 (×9): qty 2.5

## 2015-09-09 MED ORDER — DILTIAZEM HCL 60 MG PO TABS
60.0000 mg | ORAL_TABLET | Freq: Three times a day (TID) | ORAL | Status: DC
  Administered 2015-09-09 – 2015-09-10 (×2): 60 mg via ORAL
  Filled 2015-09-09 (×3): qty 1

## 2015-09-09 MED ORDER — SODIUM CHLORIDE 0.9 % IV SOLN
INTRAVENOUS | Status: DC
  Administered 2015-09-09 (×2): via INTRAVENOUS

## 2015-09-09 MED ORDER — IPRATROPIUM-ALBUTEROL 0.5-2.5 (3) MG/3ML IN SOLN: 3.0000 mL | RESPIRATORY_TRACT | Status: DC

## 2015-09-09 MED ORDER — ALBUTEROL SULFATE (2.5 MG/3ML) 0.083% IN NEBU: 2.5000 mg | INHALATION_SOLUTION | RESPIRATORY_TRACT | Status: DC | PRN

## 2015-09-09 MED ORDER — METHYLPREDNISOLONE SODIUM SUCC 125 MG IJ SOLR
60.0000 mg | Freq: Three times a day (TID) | INTRAMUSCULAR | Status: DC
  Administered 2015-09-09: 60 mg via INTRAVENOUS
  Filled 2015-09-09: qty 2

## 2015-09-09 MED ORDER — MORPHINE SULFATE (PF) 2 MG/ML IV SOLN: 2.0000 mg | INTRAVENOUS | Status: DC | PRN

## 2015-09-09 MED ORDER — ONDANSETRON HCL 4 MG/2ML IJ SOLN
4.0000 mg | Freq: Four times a day (QID) | INTRAMUSCULAR | Status: DC | PRN
Start: 2015-09-09 — End: 2015-09-11

## 2015-09-09 MED ORDER — ENOXAPARIN SODIUM 40 MG/0.4ML ~~LOC~~ SOLN
40.0000 mg | SUBCUTANEOUS | Status: DC
  Administered 2015-09-09: 40 mg via SUBCUTANEOUS
  Filled 2015-09-09: qty 0.4

## 2015-09-09 MED ORDER — LEVALBUTEROL HCL 1.25 MG/0.5ML IN NEBU
1.2500 mg | INHALATION_SOLUTION | Freq: Four times a day (QID) | RESPIRATORY_TRACT | Status: DC
  Administered 2015-09-09 – 2015-09-11 (×8): 1.25 mg via RESPIRATORY_TRACT
  Filled 2015-09-09 (×10): qty 0.5

## 2015-09-09 MED ORDER — ALLOPURINOL 100 MG PO TABS
100.0000 mg | ORAL_TABLET | Freq: Every day | ORAL | Status: DC
  Administered 2015-09-09 – 2015-09-11 (×3): 100 mg via ORAL
  Filled 2015-09-09 (×3): qty 1

## 2015-09-09 NOTE — Progress Notes (Signed)
Pt experiencing chest pain on arrival. HR was 130-140's. Dr.Niu gave a verbal order for STAT EKG. Results show normal sinus rhythm with nonspecific ST and T wave abnormality. Dr.Niu made aware. Will continue to monitor

## 2015-09-09 NOTE — H&P (Addendum)
History and Physical    Billy Beard ZOX:[id number] DOB: 12/09/1970 DOA: 09/08/2015  Referring MD/NP/PA:   PCP: Laren BoomHommel, Sean, DO Outpatient Specialists: none Patient coming from:  Home    Chief Complaint: Dry cough, wheezing and shortness of breath and chest pain  HPI: Billy Beard is a 45 y.o. male with medical history significant of hypertension, gout, tobacco abuse, pollen  Allergy, who presents with dry cough, wheezing and shortness of breath and chest pain.  Patient reports that she started having dry cough, wheezing and shortness of breath yesterday. She also has intermittent chest pain. His chest pain is located in the frontal chest, burning like pain, nonradiating, 9 out of 10 in severity. Patient reports that he travelled to [place] by airplane last week, but no tenderness over calf areas. His chest pain is not pleuritic. He has sore throat, no runny nose. Patient does not have nausea, vomiting, diarrhea, abdominal pain, symptoms of a UTI or unilateral weakness. No leg edema.  ED Course: pt was found to have negative troponin, BNP 10.5, WBC 8.9, temperature normal, transient tachycardia, tachypnea, electrolytes and renal function okay. Chest x-ray showed borderline cardiomegaly and mild diffuse interstitial pattern suggesting mild edema; no focal airspace disease or effusions.  Patient is admitted to inpatient for further treatment and observation.  Can patient participate in ADLs?  Yes   Review of Systems:   General: no fevers, chills, no changes in body weight, has fatigue HEENT: no blurry vision, hearing changes or sore throat Pulm: has dyspnea, coughing, wheezing CV: has chest pain, no palpitations Abd: no nausea, vomiting, abdominal pain, diarrhea, constipation GU: no dysuria, burning on urination, increased urinary frequency, hematuria  Ext: no leg edema Neuro: no unilateral weakness, numbness, or tingling, no vision change or hearing loss Skin: no rash MSK: No muscle  spasm, no deformity, no limitation of range of movement in spin Heme: No easy bruising.  Travel history: No recent long distant travel.  Allergy: No Known Allergies  Past Medical History  Diagnosis Date  . Hypertension   . Gout     History reviewed. No pertinent past surgical history.  Social History:  reports that he has been smoking Cigarettes.  He has been smoking about 0.25 packs per day. He does not have any smokeless tobacco history on file. He reports that he does not drink alcohol or use illicit drugs.  Family History:  Family History  Problem Relation Age of Onset  . Heart disease Mother   . Gout Father      Prior to Admission medications   Medication Sig Start Date End Date Taking? Authorizing Provider  allopurinol (ZYLOPRIM) 100 MG tablet Take 1 tablet (100 mg total) by mouth daily. 08/06/15   Laren BoomSean Hommel, DO  Icosapent Ethyl (VASCEPA) 1 g CAPS Two by mouth twice a day taken at mealtime. 08/23/15   Laren BoomSean Hommel, DO  Metoprolol Tartrate 75 MG TABS Take 75 mg by mouth 2 (two) times daily. 08/20/15   Laren BoomSean Hommel, DO    Physical Exam: Filed Vitals:   09/08/15 2319 09/08/15 2330 09/09/15 0039 09/09/15 0127  BP:  143/108 142/82   Pulse:  88 91   Temp:   97.9 F (36.6 C)   TempSrc:      Resp:  25 18   SpO2: 96% 94% 98% 96%   General: Not in acute distress HEENT:       Eyes: PERRL, EOMI, no scleral icterus.       ENT: No discharge from the  ears and nose, no pharynx injection, no tonsillar enlargement.        Neck: No JVD, no bruit, no mass felt. Heme: No neck lymph node enlargement. Cardiac: S1/S2, RRR, No murmurs, No gallops or rubs. Pulm: Has diffused wheezing bilaterally. No rales or rubs. Abd: Soft, nondistended, nontender, no rebound pain, no organomegaly, BS present. GU: No hematuria Ext: No pitting leg edema bilaterally. 2+DP/PT pulse bilaterally. Musculoskeletal: No joint deformities, No joint redness or warmth, no limitation of ROM in spin. Skin: No rashes.  Decubitus ulcers Neuro: Alert, oriented X3, cranial nerves II-XII grossly intact, moves all extremities normally. Psych: Patient is not psychotic, no suicidal or hemocidal ideation.  Labs on Admission: I have personally reviewed following labs and imaging studies  CBC:  Recent Labs Lab 09/08/15 2030  WBC 8.9  NEUTROABS 4.0  HGB 15.3  HCT 45.7  MCV 81.9  PLT 212   Basic Metabolic Panel:  Recent Labs Lab 09/08/15 2030  NA 138  K 4.0  CL 105  CO2 25  GLUCOSE 113*  BUN 16  CREATININE 0.89  CALCIUM 9.8   GFR: CrCl cannot be calculated (Unknown ideal weight.). Liver Function Tests: No results for input(s): AST, ALT, ALKPHOS, BILITOT, PROT, ALBUMIN in the last 168 hours. No results for input(s): LIPASE, AMYLASE in the last 168 hours. No results for input(s): AMMONIA in the last 168 hours. Coagulation Profile:  Recent Labs Lab 09/09/15 0125  INR 1.05   Cardiac Enzymes:  Recent Labs Lab 09/08/15 2030 09/09/15 0125  TROPONINI <0.03 <0.03   BNP (last 3 results) No results for input(s): PROBNP in the last 8760 hours. HbA1C: No results for input(s): HGBA1C in the last 72 hours. CBG: No results for input(s): GLUCAP in the last 168 hours. Lipid Profile: No results for input(s): CHOL, HDL, LDLCALC, TRIG, CHOLHDL, LDLDIRECT in the last 72 hours. Thyroid Function Tests: No results for input(s): TSH, T4TOTAL, FREET4, T3FREE, THYROIDAB in the last 72 hours. Anemia Panel: No results for input(s): VITAMINB12, FOLATE, FERRITIN, TIBC, IRON, RETICCTPCT in the last 72 hours. Urine analysis: No results found for: COLORURINE, APPEARANCEUR, LABSPEC, PHURINE, GLUCOSEU, HGBUR, BILIRUBINUR, KETONESUR, PROTEINUR, UROBILINOGEN, NITRITE, LEUKOCYTESUR Sepsis Labs: (procalcitonin:4,lacticidven:4) )No results found for this or any previous visit (from the past 240 hour(s)).   Radiological Exams on Admission: Dg Chest Port 1 View  09/08/2015  CLINICAL DATA:  Severe  shortness of breath. EXAM: PORTABLE CHEST - 1 VIEW COMPARISON:  None. FINDINGS: The heart is mildly enlarged. This is exaggerated by low lung volumes. There is mild interstitial prominence. No effusions are present. There is no focal airspace disease. IMPRESSION: 1. Borderline cardiomegaly and mild diffuse interstitial pattern suggesting mild edema. 2. No focal airspace disease or effusions. Electronically Signed   By: Billy Roberts M.D.   On: 09/08/2015 21:03     EKG: Independently reviewed. QTC 397, nonspecific T wave change.  Assessment/Plan Principal Problem:   Bronchospasm, acute Active Problems:   HTN (hypertension)   Obesity   Smoker   Gout   Prediabetes   Chest pain   Sepsis (HCC)   Acute Bronchospasm: Patient has diffused bilateral wheezing, indicating acute bronchospasm. Likely due to upper respiratory viral infection, but cannot rule out bacterial bronchitis. No infiltration by chest x-ray.  -will admit patient to telemetry bed  -Nebulizers: scheduled Atroven and prn xopenex -Solu-Medrol 60 mg IV tid  -Oral azithromycin for 5 days.  -Mucinex for cough  -Follow up respiratory virus panel, Flu pcr -Rapid strep screen test  Addendum: D-dimer is negative, less likely to have PE. Pt's lactic acid is elevated at 6.0, plus tachypnea and tachycardia, pt has sepsis. -will get Procalcitonin and trend lactic acid levels per sepsis protocol. -IVF: 2.5 L of NS bolus, this will add up to total of 3.0 L NS, followed by 100cc/h -add IV rocephin in addition to Azithyromycin -f/u blood culture, sputum culture, and urine strep antigen  Chest pain: most likely due to acid reflex given the burning pain in nature. Since pt has hypertension, tobacco abuse and prediabetes, will do chest pain rule out study. Pt had long distance traveling recently, we'll also need to rule out PE. Currently pt is chest pain free. - protonix - cycle CE q6 x3 and repeat her EKG in the am  - Nitroglycerin,  Morphine, and aspirin - Risk factor stratification: will check FLP, UDS and A1C  - 2d echo - Stat D-dimer, if positive, will get CTA to r/o PE - Protonix and Mylanta for possible acid reflux  Addendum: the repeated EKG showed mild ST elevation in V1-V2, and mild T-wave inversion in inferior leads and V5-V6. I asked Card, dr. Tresa Endo to have reviewed this EKG. Since pt is chest pain free, will not need to activate cath lab at this moment per Dr. Tresa Endo. Will continue to do chest pain rule out study now  HTN: -continue metoprolol. Pt has taken his medication for 7 years without breathing problem problem. I don't think metoprolol contributed to his bronchospasm today.  Tobacco abuse: -Did counseling about importance of quitting smoking -Nicotine patch  Gout: stable -continue home allopurinol  DVT ppx: SQ Lovenox Code Status: Full code Family Communication:  Yes, patient's brother-in law  at bed side Disposition Plan:  Anticipate discharge back to previous home environment Consults called: None Admission status:  obs / tele   Date of Service 09/09/2015    Lorretta Harp [facility] Pager [phone]  If 7PM-7AM, please contact night-coverage [email or web address] Password [email or web address] 09/09/2015, 2:22 AM

## 2015-09-09 NOTE — Progress Notes (Signed)
CRITICAL VALUE ALERT  Critical value received: lactic acid 6.0  Date of notification:  09/09/2015  Time of notification:  0215  Critical value read back:Yes.    Nurse who received alert:  S,Gregg Holster  MD notified (1st page):  Dr.Niu  Time of first page:  0216  MD notified (2nd page):  Time of second page:  Responding MD:    Time MD responded:

## 2015-09-09 NOTE — Progress Notes (Signed)
VASCULAR LAB PRELIMINARY  PRELIMINARY  PRELIMINARY  PRELIMINARY  Bilateral lower extremity venous duplex completed.    Preliminary report:  Bilateral:  No evidence of DVT, superficial thrombosis, or Baker's Cyst.   Briseidy Spark, RVS 09/09/2015, 3:18 PM

## 2015-09-09 NOTE — Progress Notes (Addendum)
PROGRESS NOTE        PATIENT DETAILS Name: Billy Beard Age: 45 y.o. Sex: male Date of Birth: 09-24-1970 Admit Date: 09/08/2015 Admitting Physician Lorretta Harp, MD ZOX:[id number], Gregary Signs, DO  Brief Narrative: Patient is a 45 y.o. male with hx of allergies and htn-presented with several day hx of cough and shortness of breath.Thought to have acute bronchitis and admitted for further evaluation and treatment  Subjective: Feels much better.   Assessment/Plan: Principal Problem: Acute Bronchitis: improving rapidly-with only a few scattered rhonchi, decrease solumedrol, stop Rocephin, continue Zithromax for a total of 3 days. Continue bronchodilators.Suspect seasonal allergies worsening symptoms with post nasal drip-hence start claritin, flonase and afrin nasal spray. Follow.  Active Problems: Chest pain: describes more of a burning sensation-chest pain is not provoked by physical activity. Troponin negative, await Echo-if no wall motion abnormality-suspect further work up can be done in the outpatient setting.   Addendum: having episodes of chest pain with RVR-suspect demand ischemia. Spoke with Dr Darnelle Going is agreeable with short term anticoagulation for now, recommends stress test in am. I have ordered. Cardiology will formally consult tomorrow, or tonight if Afib rvr is persistent.  PAF RVR: having spells of Afib/Aflutter with RVR-will increase Lopressor to 100 mg BID, add Cardizem 60 mg BID. CHADS2VASC of just 1. Will d/w cards whether or not anticoagulation is recommended with chest pain. Echo currently pending.  Elevated Lactic acid: suspect from hypoxia/sob-no evidence of sepsis or hypovolumia.Belly is soft-doubt any further work up required.  WUJ:[id number] Metoprolol  Gout: stable-without any flare-continue Allopurinol  Tobacco Abuse:counseled.   DVT Prophylaxis: Prophylactic Lovenox   Code Status: Full code   Family Communication: None at  bedside  Disposition Plan: Remain inpatient-home tomorrow  Antimicrobial agents: Rocephin 4/20>>4/20 Zithromax 4/20>>  Procedures: None  CONSULTS:  None  Time spent: 25 minutes-Greater than 50% of this time was spent in counseling, explanation of diagnosis, planning of further management, and coordination of care.  MEDICATIONS: Anti-infectives    Start     Dose/Rate Route Frequency Ordered Stop   09/10/15 1000  azithromycin (ZITHROMAX) tablet 250 mg     250 mg Oral Daily 09/09/15 0104 09/14/15 0959   09/09/15 1000  azithromycin (ZITHROMAX) tablet 500 mg     500 mg Oral Daily 09/09/15 0104 09/09/15 0850   09/09/15 0300  cefTRIAXone (ROCEPHIN) 1 g in dextrose 5 % 50 mL IVPB     1 g 100 mL/hr over 30 Minutes Intravenous Every 24 hours 09/09/15 0221        Scheduled Meds: . allopurinol  100 mg Oral Daily  . aspirin EC  325 mg Oral Daily  . [START ON 09/10/2015] azithromycin  250 mg Oral Daily  . cefTRIAXone (ROCEPHIN)  IV  1 g Intravenous Q24H  . dextromethorphan-guaiFENesin  1 tablet Oral BID  . enoxaparin (LOVENOX) injection  40 mg Subcutaneous Q24H  . fluticasone  2 spray Each Nare Daily  . ipratropium  0.5 mg Nebulization Q6H  . levalbuterol  1.25 mg Nebulization Q6H WA  . loratadine  10 mg Oral Daily  . methylPREDNISolone (SOLU-MEDROL) injection  40 mg Intravenous Q12H  . metoprolol tartrate  75 mg Oral BID  . nicotine  21 mg Transdermal Daily  . oxymetazoline  1 spray Each Nare BID  . pantoprazole  40 mg Oral Q1200  . sodium chloride flush  3  mL Intravenous Q12H   Continuous Infusions: . sodium chloride 100 mL/hr at 09/09/15 1050   PRN Meds:.acetaminophen **OR** acetaminophen, alum & mag hydroxide-simeth, benzonatate, morphine injection, nitroGLYCERIN, ondansetron **OR** ondansetron (ZOFRAN) IV   PHYSICAL EXAM: Vital signs: Filed Vitals:   09/09/15 0127 09/09/15 0226 09/09/15 0556 09/09/15 0854  BP:   138/81   Pulse:   84   Temp:   98 F (36.7 C)     TempSrc:      Resp:   17   Height:  5\' 3"  (1.6 m)    Weight:  83.7 kg (184 lb 8.4 oz)    SpO2: 96%  96% 95%   Filed Weights   09/09/15 0226  Weight: 83.7 kg (184 lb 8.4 oz)   Body mass index is 32.7 kg/(m^2).   Gen Exam: Awake and alert with clear speech. Not in any distress  Neck: Supple, No JVD.   Chest: few scattered rhonchi CVS: S1 S2 Regular, no murmurs.  Abdomen: soft, BS +, non tender, non distended.  Extremities: no edema, lower extremities warm to touch. Neurologic: Non Focal.   Skin: No Rash or lesions   Wounds: N/A.    LABORATORY DATA: CBC:  Recent Labs Lab 09/08/15 2030 09/09/15 0632  WBC 8.9 9.8  NEUTROABS 4.0  --   HGB 15.3 13.9  HCT 45.7 43.1  MCV 81.9 81.6  PLT 212 214    Basic Metabolic Panel:  Recent Labs Lab 09/08/15 2030 09/09/15 0632  NA 138 139  K 4.0 4.5  CL 105 107  CO2 25 19*  GLUCOSE 113* 160*  BUN 16 12  CREATININE 0.89 0.89  CALCIUM 9.8 8.9    GFR: Estimated Creatinine Clearance: 103.4 mL/min (by C-G formula based on Cr of 0.89).  Liver Function Tests:  Recent Labs Lab 09/09/15 0632  AST 67*  ALT 109*  ALKPHOS 73  BILITOT 0.6  PROT 6.8  ALBUMIN 3.3*   No results for input(s): LIPASE, AMYLASE in the last 168 hours. No results for input(s): AMMONIA in the last 168 hours.  Coagulation Profile:  Recent Labs Lab 09/09/15 0125  INR 1.05    Cardiac Enzymes:  Recent Labs Lab 09/08/15 2030 09/09/15 0125 09/09/15 0632  TROPONINI <0.03 <0.03 <0.03    BNP (last 3 results) No results for input(s): PROBNP in the last 8760 hours.  HbA1C: No results for input(s): HGBA1C in the last 72 hours.  CBG: No results for input(s): GLUCAP in the last 168 hours.  Lipid Profile:  Recent Labs  09/09/15 0426  CHOL 206*  HDL 43  LDLCALC 140*  TRIG 116  CHOLHDL 4.8    Thyroid Function Tests: No results for input(s): TSH, T4TOTAL, FREET4, T3FREE, THYROIDAB in the last 72 hours.  Anemia Panel: No results for  input(s): VITAMINB12, FOLATE, FERRITIN, TIBC, IRON, RETICCTPCT in the last 72 hours.  Urine analysis: No results found for: COLORURINE, APPEARANCEUR, LABSPEC, PHURINE, GLUCOSEU, HGBUR, BILIRUBINUR, KETONESUR, PROTEINUR, UROBILINOGEN, NITRITE, LEUKOCYTESUR  Sepsis Labs: Lactic Acid, Venous    Component Value Date/Time   LATICACIDVEN 6.5* 09/09/2015 0403    MICROBIOLOGY: No results found for this or any previous visit (from the past 240 hour(s)).  RADIOLOGY STUDIES/RESULTS: Dg Chest Port 1 View  09/08/2015  CLINICAL DATA:  Severe shortness of breath. EXAM: PORTABLE CHEST - 1 VIEW COMPARISON:  None. FINDINGS: The heart is mildly enlarged. This is exaggerated by low lung volumes. There is mild interstitial prominence. No effusions are present. There is no focal airspace disease.  IMPRESSION: 1. Borderline cardiomegaly and mild diffuse interstitial pattern suggesting mild edema. 2. No focal airspace disease or effusions. Electronically Signed   By: Marin Roberts M.D.   On: 09/08/2015 21:03      Jeoffrey Massed, MD  [facility] Pager:[phone]  If 7PM-7AM, please contact night-coverage [email or web address] Password [facility] 09/09/2015, 12:36 PM

## 2015-09-09 NOTE — Progress Notes (Signed)
ANTICOAGULATION CONSULT NOTE  Pharmacy Consult for Heparin Indication: atrial fibrillation  No Known Allergies  Patient Measurements: Height: 5\' 3"  (160 cm) Weight: 184 lb 8.4 oz (83.7 kg) IBW/kg (Calculated) : 56.9  Labs:  Recent Labs  09/08/15 2030 09/09/15 0125 09/09/15 0632 09/09/15 1330  HGB 15.3  --  13.9  --   HCT 45.7  --  43.1  --   PLT 212  --  214  --   APTT  --  31  --   --   LABPROT  --  13.9  --   --   INR  --  1.05  --   --   CREATININE 0.89  --  0.89  --   TROPONINI <0.03 <0.03 <0.03 <0.03    Estimated Creatinine Clearance: 103.4 mL/min (by C-G formula based on Cr of 0.89).   Medical History: Past Medical History  Diagnosis Date  . Hypertension   . Gout     Assessment: 45 year old male admitted with bronchitis having episodes of chest pain and Afib with RVR.  Pharmacy asked to begin heparin without a bolus  Goal of Therapy:  Heparin level 0.3-0.7 units/ml Monitor platelets by anticoagulation protocol: Yes   Plan:  Heparin drip at 1300 units / hr Daily heparin level, CBC  Thank you Okey RegalLisa Kierah Goatley, PharmD [phone]  09/09/2015,6:57 PM

## 2015-09-09 NOTE — Progress Notes (Signed)
Pt has arrived on the floor. Admitting doctor made aware

## 2015-09-09 NOTE — Progress Notes (Addendum)
CRITICAL VALUE ALERT  Critical value received:  Lactic acid 6.5  Date of notification:  09/09/2015  Time of notification:  0529  Critical value read back:Yes.    Nurse who received alert:  S.Nolen MuMckinney  MD notified (1st page):  Dr.Niu  Time of first page:  [phone]  Responding MD:  Dr.Niu  Time MD responded:  [phone], States that he would like to just continue with NS at 1000 ml/hr. Pt has just finished up with a 2.5L bolus and will start continuous fluids at this time. Will continue to monitor

## 2015-09-10 ENCOUNTER — Inpatient Hospital Stay (HOSPITAL_COMMUNITY): Payer: 59

## 2015-09-10 DIAGNOSIS — R079 Chest pain, unspecified: Secondary | ICD-10-CM

## 2015-09-10 DIAGNOSIS — J9801 Acute bronchospasm: Secondary | ICD-10-CM

## 2015-09-10 DIAGNOSIS — I10 Essential (primary) hypertension: Secondary | ICD-10-CM

## 2015-09-10 DIAGNOSIS — R7303 Prediabetes: Secondary | ICD-10-CM

## 2015-09-10 DIAGNOSIS — I4891 Unspecified atrial fibrillation: Secondary | ICD-10-CM

## 2015-09-10 DIAGNOSIS — I5031 Acute diastolic (congestive) heart failure: Secondary | ICD-10-CM

## 2015-09-10 LAB — RESPIRATORY VIRUS PANEL
ADENOVIRUS: NEGATIVE
INFLUENZA A: NEGATIVE
Influenza B: NEGATIVE
Metapneumovirus: NEGATIVE
PARAINFLUENZA 2 A: NEGATIVE
Parainfluenza 1: NEGATIVE
Parainfluenza 3: NEGATIVE
RESPIRATORY SYNCYTIAL VIRUS A: NEGATIVE
RESPIRATORY SYNCYTIAL VIRUS B: NEGATIVE
Rhinovirus: NEGATIVE

## 2015-09-10 LAB — BRAIN NATRIURETIC PEPTIDE: B NATRIURETIC PEPTIDE 5: 225.5 pg/mL — AB (ref 0.0–100.0)

## 2015-09-10 LAB — HEPARIN LEVEL (UNFRACTIONATED)
Heparin Unfractionated: 0.85 IU/mL — ABNORMAL HIGH (ref 0.30–0.70)
Heparin Unfractionated: 1.1 IU/mL — ABNORMAL HIGH (ref 0.30–0.70)

## 2015-09-10 LAB — COMPREHENSIVE METABOLIC PANEL
ALK PHOS: 66 U/L (ref 38–126)
ALT: 85 U/L — AB (ref 17–63)
ANION GAP: 11 (ref 5–15)
AST: 43 U/L — ABNORMAL HIGH (ref 15–41)
Albumin: 3.2 g/dL — ABNORMAL LOW (ref 3.5–5.0)
BILIRUBIN TOTAL: 0.7 mg/dL (ref 0.3–1.2)
BUN: 21 mg/dL — ABNORMAL HIGH (ref 6–20)
CALCIUM: 9 mg/dL (ref 8.9–10.3)
CO2: 19 mmol/L — ABNORMAL LOW (ref 22–32)
Chloride: 108 mmol/L (ref 101–111)
Creatinine, Ser: 1.01 mg/dL (ref 0.61–1.24)
GFR calc non Af Amer: >60 mL/min (ref >60)
Glucose, Bld: 272 mg/dL — ABNORMAL HIGH (ref 65–99)
Potassium: 4.2 mmol/L (ref 3.5–5.1)
Sodium: 138 mmol/L (ref 135–145)
TOTAL PROTEIN: 6.2 g/dL — AB (ref 6.5–8.1)

## 2015-09-10 LAB — NM MYOCAR MULTI W/SPECT W/WALL MOTION / EF
CHL CUP NUCLEAR SDS: 1
CHL CUP NUCLEAR SRS: 4
CHL CUP NUCLEAR SSS: 5
CHL CUP RESTING HR STRESS: 70 {beats}/min
CSEPPHR: 81 {beats}/min
LHR: 0.28
LV dias vol: 117 mL (ref 62–150)
LVSYSVOL: 48 mL
TID: 1.26

## 2015-09-10 LAB — CBC
HEMATOCRIT: 41.4 % (ref 39.0–52.0)
HEMOGLOBIN: 13.2 g/dL (ref 13.0–17.0)
MCH: 26.2 pg (ref 26.0–34.0)
MCHC: 31.9 g/dL (ref 30.0–36.0)
MCV: 82.1 fL (ref 78.0–100.0)
Platelets: 228 10*3/uL (ref 150–400)
RBC: 5.04 MIL/uL (ref 4.22–5.81)
RDW: 13.1 % (ref 11.5–15.5)
WBC: 13.5 10*3/uL — ABNORMAL HIGH (ref 4.0–10.5)

## 2015-09-10 LAB — ECHOCARDIOGRAM COMPLETE
Height: 63 in
WEIGHTICAEL: 2952.4 [oz_av]

## 2015-09-10 LAB — HEMOGLOBIN A1C
HEMOGLOBIN A1C: 6.1 % — AB (ref 4.8–5.6)
Mean Plasma Glucose: 128 mg/dL

## 2015-09-10 LAB — TROPONIN I: Troponin I: <0.03 ng/mL (ref <0.031)

## 2015-09-10 MED ORDER — LEVALBUTEROL HCL 0.63 MG/3ML IN NEBU
0.6300 mg | INHALATION_SOLUTION | RESPIRATORY_TRACT | Status: DC | PRN
  Administered 2015-09-10 – 2015-09-11 (×2): 0.63 mg via RESPIRATORY_TRACT
  Filled 2015-09-10 (×2): qty 3

## 2015-09-10 MED ORDER — PREDNISONE 20 MG PO TABS
40.0000 mg | ORAL_TABLET | Freq: Every day | ORAL | Status: DC
  Administered 2015-09-11: 40 mg via ORAL
  Filled 2015-09-10: qty 2

## 2015-09-10 MED ORDER — ATORVASTATIN CALCIUM 40 MG PO TABS
40.0000 mg | ORAL_TABLET | Freq: Every day | ORAL | Status: DC
  Administered 2015-09-10: 40 mg via ORAL
  Filled 2015-09-10: qty 1

## 2015-09-10 MED ORDER — ASPIRIN 81 MG PO CHEW
81.0000 mg | CHEWABLE_TABLET | Freq: Every day | ORAL | Status: DC
  Administered 2015-09-10 – 2015-09-11 (×2): 81 mg via ORAL
  Filled 2015-09-10 (×2): qty 1

## 2015-09-10 MED ORDER — REGADENOSON 0.4 MG/5ML IV SOLN
INTRAVENOUS | Status: AC
  Filled 2015-09-10: qty 5

## 2015-09-10 MED ORDER — TECHNETIUM TC 99M SESTAMIBI - CARDIOLITE
30.0000 | Freq: Once | INTRAVENOUS | Status: AC | PRN
  Administered 2015-09-10: 30 via INTRAVENOUS

## 2015-09-10 MED ORDER — DILTIAZEM HCL 30 MG PO TABS
30.0000 mg | ORAL_TABLET | Freq: Three times a day (TID) | ORAL | Status: DC
  Administered 2015-09-10 – 2015-09-11 (×2): 30 mg via ORAL
  Filled 2015-09-10 (×2): qty 1

## 2015-09-10 MED ORDER — FUROSEMIDE 10 MG/ML IJ SOLN
20.0000 mg | Freq: Once | INTRAMUSCULAR | Status: AC
  Administered 2015-09-10: 20 mg via INTRAVENOUS
  Filled 2015-09-10: qty 2

## 2015-09-10 MED ORDER — METOPROLOL TARTRATE 100 MG PO TABS
100.0000 mg | ORAL_TABLET | Freq: Two times a day (BID) | ORAL | Status: DC
  Administered 2015-09-10: 100 mg via ORAL
  Filled 2015-09-10 (×2): qty 1

## 2015-09-10 MED ORDER — HEPARIN (PORCINE) IN NACL 100-0.45 UNIT/ML-% IJ SOLN: 950.0000 [IU]/h | INTRAMUSCULAR | Status: DC

## 2015-09-10 MED ORDER — TECHNETIUM TC 99M SESTAMIBI GENERIC - CARDIOLITE
10.0000 | Freq: Once | INTRAVENOUS | Status: AC | PRN
  Administered 2015-09-10: 10 via INTRAVENOUS

## 2015-09-10 MED ORDER — REGADENOSON 0.4 MG/5ML IV SOLN
0.4000 mg | Freq: Once | INTRAVENOUS | Status: AC
  Administered 2015-09-10: 0.4 mg via INTRAVENOUS
  Filled 2015-09-10: qty 5

## 2015-09-10 MED ORDER — HEPARIN (PORCINE) IN NACL 100-0.45 UNIT/ML-% IJ SOLN
950.0000 [IU]/h | INTRAMUSCULAR | Status: DC
  Administered 2015-09-10: 950 [IU]/h via INTRAVENOUS

## 2015-09-10 NOTE — Progress Notes (Signed)
  Echocardiogram 2D Echocardiogram has been performed.  Arvil ChacoFoster, Bryn Saline 09/10/2015, 12:37 PM

## 2015-09-10 NOTE — Progress Notes (Signed)
     The patient was seen in nuclear medicine for a lexiscan myoview. He tolerated the procedure well. No acute ST or TW changes on ECG. Await nuclear images.    Tynesha Free Stern PA-C  [facility]    

## 2015-09-10 NOTE — Progress Notes (Signed)
Inpatient Diabetes Program Recommendations  AACE/ADA: New Consensus Statement on Inpatient Glycemic Control (2015)  Target Ranges:  Prepandial:   less than 140 mg/dL      Peak postprandial:   less than 180 mg/dL (1-2 hours)      Critically ill patients:  140 - 180 mg/dL   Results for Billy Beard, Billy Beard (MRN [id number]) as of 09/10/2015 08:44  Ref. Range 09/08/2015 20:30 09/09/2015 06:32 09/10/2015 00:57  Glucose Latest Ref Range: 65-99 mg/dL 244113 (H) 010160 (H) 272272 (H)   Review of Glycemic Control  Diabetes history: None Current orders for Inpatient glycemic control: None  Inpatient Diabetes Program Recommendations:   Patient receiving IV Solumedrol 40 mg Q12hrs. Glucose increased to 272 this am. Please consider CBGs and Novolog Sensitive Correction TID while inpatient and on steroids.  Thanks,  Christena DeemShannon Mieko Kneebone RN, MSN, [facility] Inpatient Diabetes Coordinator Team Pager [phone] (8a-5p)

## 2015-09-10 NOTE — Consult Note (Addendum)
CARDIOLOGY CONSULT NOTE   Patient ID: Billy Beard MRN: [id number] DOB/AGE: 1970/07/11 45 y.o.  Admit date: 09/08/2015  Primary Physician   Laren Boom, DO Primary Cardiologist: New Reason for Consultation: Afib/Chest pain   HPI: Billy Beard is a 45 year old male with past medical history of hypertension, gout and tobacco abuse. Admitted for SOB and wheezing, had burning chest pain and in Afib RVR.   He presented to ED on 09/09/15 with SOB and wheezing.  He also had dry cough and chest pain. Chest pain resolved with breathing treatments. He describes chest pain as a burning sensation, it is not provoked by any physical activity. Troponins negative x4. EKG had 1mm elevation V1-V2. He had recently travelled a long distance, PE was ruled out by CTA.    He began having episodes of Afib/RVR with rates in the 130's. He was started on heparin drip.  He reported intermittent chest pain with the Afib. He was started on PO cardizem and converted to NSR over night on 09/10/15.   Overnight he had one episode of the same burning chest pain, with associated shortness of breath. He says it's hard to describe the pain but he feels like it's almost always there. He is minimally active at home, does not exercise. Reports no shortness of breath or chest pain with activities such as climbing stairs recently. He is a current smoker and has been smoking for about 10 years. Denies any history of diabetes or stroke.    Past Medical History  Diagnosis Date  . Hypertension   . Gout      History reviewed. No pertinent past surgical history.  No Known Allergies  I have reviewed the patient's current medications . allopurinol  100 mg Oral Daily  . azithromycin  250 mg Oral Daily  . dextromethorphan-guaiFENesin  1 tablet Oral BID  . diltiazem  60 mg Oral Q8H  . fluticasone  2 spray Each Nare Daily  . ipratropium  0.5 mg Nebulization Q6H  . levalbuterol  1.25 mg Nebulization Q6H WA  . loratadine  10 mg  Oral Daily  . methylPREDNISolone (SOLU-MEDROL) injection  40 mg Intravenous Q12H  . metoprolol tartrate  100 mg Oral BID  . nicotine  21 mg Transdermal Daily  . oxymetazoline  1 spray Each Nare BID  . pantoprazole  40 mg Oral Q1200  . sodium chloride flush  3 mL Intravenous Q12H   . heparin 1,150 Units/hr (09/10/15 0238)   acetaminophen **OR** acetaminophen, alum & mag hydroxide-simeth, benzonatate, morphine injection, nitroGLYCERIN, ondansetron **OR** ondansetron (ZOFRAN) IV  Prior to Admission medications   Medication Sig Start Date End Date Taking? Authorizing Provider  allopurinol (ZYLOPRIM) 100 MG tablet Take 1 tablet (100 mg total) by mouth daily. 08/06/15  Yes Sean Hommel, DO  loratadine (CLARITIN) 10 MG tablet Take 10 mg by mouth daily.   Yes Historical Provider, MD  Metoprolol Tartrate 75 MG TABS Take 75 mg by mouth 2 (two) times daily. 08/20/15  Yes Sean Hommel, DO  Icosapent Ethyl (VASCEPA) 1 g CAPS Two by mouth twice a day taken at mealtime. Patient taking differently: Take 2 g by mouth 2 (two) times daily. Two by mouth twice a day taken at mealtime. 08/23/15   Laren Boom, DO     Social History   Social History  . Marital Status: Single    Spouse Name: N/A  . Number of Children: N/A  . Years of Education: N/A   Occupational History  .  Not on file.   Social History Main Topics  . Smoking status: Current Every Day Smoker -- 0.25 packs/day    Types: Cigarettes  . Smokeless tobacco: Not on file  . Alcohol Use: No  . Drug Use: No  . Sexual Activity: Not on file   Other Topics Concern  . Not on file   Social History Narrative    No family status information on file.   Family History  Problem Relation Age of Onset  . Heart disease Mother   . Gout Father      ROS:  Full 14 point review of systems complete and found to be negative unless listed above.  Physical Exam: Blood pressure 128/78, pulse 56, temperature 97.5 F (36.4 C), temperature source Oral, resp.  rate 22, height 5\' 3"  (1.6 m), weight 184 lb 8.4 oz (83.7 kg), SpO2 92 %.  General: Well developed, well nourished, male in no acute distress Head: Eyes PERRLA, No xanthomas.   Normocephalic and atraumatic, oropharynx without edema or exudate.   Lungs: Scattered wheezes throughout Heart: HRRR S1 S2, no rub/gallop, No murmur. Pulses are 2+ extrem.   Neck: No carotid bruits. No lymphadenopathy.  No JVD. Abdomen: Bowel sounds present, abdomen soft and non-tender without masses or hernias noted. Msk:  No spine or cva tenderness. No weakness, no joint deformities or effusions. Extremities: No clubbing or cyanosis. No edema.  Neuro: Alert and oriented X 3. No focal deficits noted. Psych:  Good affect, responds appropriately Skin: No rashes or lesions noted.  Labs:   Lab Results  Component Value Date   WBC 13.5* 09/10/2015   HGB 13.2 09/10/2015   HCT 41.4 09/10/2015   MCV 82.1 09/10/2015   PLT 228 09/10/2015    Recent Labs  09/09/15 0125  INR 1.05    Recent Labs Lab 09/10/15 0057  NA 138  K 4.2  CL 108  CO2 19*  BUN 21*  CREATININE 1.01  CALCIUM 9.0  PROT 6.2*  BILITOT 0.7  ALKPHOS 66  ALT 85*  AST 43*  GLUCOSE 272*  ALBUMIN 3.2*    Recent Labs  09/09/15 0632 09/09/15 1330 09/09/15 1926 09/10/15 0057  TROPONINI <0.03 <0.03 <0.03 <0.03    B NATRIURETIC PEPTIDE  Date/Time Value Ref Range Status  09/08/2015 08:30 PM 10.5 0.0 - 100.0 pg/mL Final   Lab Results  Component Value Date   CHOL 206* 09/09/2015   HDL 43 09/09/2015   LDLCALC 140* 09/09/2015   TRIG 116 09/09/2015   Lab Results  Component Value Date   DDIMER <0.27 09/09/2015   No results found for: LIPASE, AMYLASE TSH  Date/Time Value Ref Range Status  09/09/2015 06:29 PM 0.519 0.350 - 4.500 uIU/mL Final    Echo: Pending  ECG:  NSR  Radiology:  Dg Chest Port 1 View  09/08/2015  CLINICAL DATA:  Severe shortness of breath. EXAM: PORTABLE CHEST - 1 VIEW COMPARISON:  None. FINDINGS: The heart  is mildly enlarged. This is exaggerated by low lung volumes. There is mild interstitial prominence. No effusions are present. There is no focal airspace disease. IMPRESSION: 1. Borderline cardiomegaly and mild diffuse interstitial pattern suggesting mild edema. 2. No focal airspace disease or effusions. Electronically Signed   By: Marin Robertshristopher  Mattern M.D.   On: 09/08/2015 21:03    ASSESSMENT AND PLAN:    Principal Problem:   Bronchospasm, acute Active Problems:   HTN (hypertension)   Obesity   Smoker   Gout   Prediabetes   Chest  pain   Sepsis (HCC)   Bronchospasm  1. Chest pain: Patient continues to have intermittent burning chest pain with associated shortness of breath. He had one episode overnight. Internal medicine has ordered a [employer] for today. Troponins continued to be negative 3. EKG is not concerning for ischemia. Echo is pending. He is on a beta blocker, we will add moderate dose statin as his LDL is 140. His cardiac risk factors include family history, current smoker, hyperlipidemia, and prediabetes. Will follow stress test results.  2. Atrial fibrillation with RVR: Patient with A. fib RVR, now in normal sinus rhythm. He was started on heparin drip for anticoagulation.  This patients CHA2DS2-VASc Score and unadjusted Ischemic Stroke Rate (% per year) is equal to 0.6 % stroke rate/year from a score of 1 Above score calculated as 1 point each if present [CHF, HTN, DM, Vascular=MI/PAD/Aortic Plaque, Age if 65-74, or Male], 2 points each if present [Age > 75, or Stroke/TIA/TE]  His CHA2DS2-VASc score is only 1. M.D. to advise on long-term anticoagulation. A. fib RVR was in the setting of acute bronchitis with elevated lactic acid. He converted to normal sinus rhythm with PO diltiazem.   Signed: Little Ishikawa, NP 09/10/2015 6:58 AM Pager [phone]  Co-Sign MD The patient has been seen in conjunction with Suzzette Righter, NP. All aspects of care have been considered and  discussed. The patient has been personally interviewed, examined, and all clinical data has been reviewed.   Presented with presumed 48 hour history of A. fib with RVR producing cough, dyspnea, chest burning, and evidence of congestion on chest x-ray.  Agree with the note as outlined above. Duration of atrial fibrillation was short enough that I do not believe long-term anticoagulation is needed unless significant structural abnormalities identified on cardiac echo. The Chads Vasc score is 1 (hypertension).  Myocardial perfusion imaging will exclude coronary disease. Myocardial infarction has been excluded by enzymes.  Acute diastolic heart failure precipitated by atrial fibrillation may require diuresis to completely resolve.  This patient needs aggressive risk factor modification of lipids, glucose tolerance, and blood pressure in particular.  A single dose of Lasix 20 mg IV has been given.  Aspirin for now and final decision concerning anticoagulation after echo has been evaluated.

## 2015-09-10 NOTE — Progress Notes (Addendum)
ANTICOAGULATION CONSULT NOTE  Pharmacy Consult for Heparin Indication: atrial fibrillation  No Known Allergies  Patient Measurements: Height: 5\' 3"  (160 cm) Weight: 184 lb 8.4 oz (83.7 kg) IBW/kg (Calculated) : 56.9  Heparin dosing wt: 75 kg  Labs:  Recent Labs  09/08/15 2030 09/09/15 0125 09/09/15 0632 09/09/15 1330 09/09/15 1926 09/10/15 0057 09/10/15 0727  HGB 15.3  --  13.9  --   --  13.2  --   HCT 45.7  --  43.1  --   --  41.4  --   PLT 212  --  214  --   --  228  --   APTT  --  31  --   --   --   --   --   LABPROT  --  13.9  --   --   --   --   --   INR  --  1.05  --   --   --   --   --   HEPARINUNFRC  --   --   --   --   --  0.85* 1.10*  CREATININE 0.89  --  0.89  --   --  1.01  --   TROPONINI <0.03 <0.03 <0.03 <0.03 <0.03 <0.03  --     Estimated Creatinine Clearance: 91.1 mL/min (by C-G formula based on Cr of 1.01).  Assessment: 45 year old male on heparin for afib.  Heparin level supratherapeutic x2 despite a rate decrease last evening.  No bleeding/oozing noted by RN. He went to nuc med for stress test this morning. Hggb and plts stable.  Goal of Therapy:  Heparin level 0.3-0.7 units/ml Monitor platelets by anticoagulation protocol: Yes   Plan:  -hold heparin x1h after pt returns to floor (returned ~1120), restart at lower rate of 950 units/hr at 1230 -heparin level 6h after restart -daily HL and CBC -follow for s/s bleeding and possible long term AC- unlikely given low CHADSVASC score  Taj Arteaga D. Jefte Carithers, PharmD, BCPS Clinical Pharmacist Pager: [phone] 09/10/2015 10:46 AM

## 2015-09-10 NOTE — Progress Notes (Signed)
ANTICOAGULATION CONSULT NOTE  Pharmacy Consult for Heparin Indication: atrial fibrillation  No Known Allergies  Patient Measurements: Height: 5\' 3"  (160 cm) Weight: 184 lb 8.4 oz (83.7 kg) IBW/kg (Calculated) : 56.9  Heparin dosing wt: 75 kg  Labs:  Recent Labs  09/08/15 2030 09/09/15 0125 09/09/15 0632 09/09/15 1330 09/09/15 1926 09/10/15 0057  HGB 15.3  --  13.9  --   --  13.2  HCT 45.7  --  43.1  --   --  41.4  PLT 212  --  214  --   --  228  APTT  --  31  --   --   --   --   LABPROT  --  13.9  --   --   --   --   INR  --  1.05  --   --   --   --   HEPARINUNFRC  --   --   --   --   --  0.85*  CREATININE 0.89  --  0.89  --   --   --   TROPONINI <0.03 <0.03 <0.03 <0.03 <0.03  --     Estimated Creatinine Clearance: 103.4 mL/min (by C-G formula based on Cr of 0.89).  Assessment: 45 year old male on heparin for afib.  Heparin level supratherapeutic on 1300 units/hr - drawn ~5 hours post gtt start but no bolus given so should be relatively accurate. No bleeding noted.  Goal of Therapy:  Heparin level 0.3-0.7 units/ml Monitor platelets by anticoagulation protocol: Yes   Plan:  Decrease heparin drip to 1150 units / hr Will f/u 6 hr heparin level  Christoper Fabianaron Duayne Brideau, PharmD, BCPS Clinical pharmacist, pager [phone] 09/10/2015,1:48 AM

## 2015-09-10 NOTE — Progress Notes (Addendum)
PROGRESS NOTE        PATIENT DETAILS Name: Billy Beard Age: 45 y.o. Sex: male Date of Birth: [birth year] Admit Date: 09/08/2015 Admitting Physician Lorretta Harp, MD ZOX:[id number], Gregary Signs, DO  Brief Narrative: Patient is a 45 y.o. male with hx of allergies and htn-presented with several day hx of cough and shortness of breath.Thought to have acute bronchitis and admitted for further evaluation and treatment. Hospital course has been complicated by development of intermittent chest pain when having episodes of A. fib with RVR.  Subjective: Feels much better.   Assessment/Plan: Principal Problem: Acute Bronchitis: Significantly improved, no rhonchi on exam today. Continue bronchodilators, will stop Solu-Medrol and transitioned to prednisone. Continue Zithromax-stop date 4/22. Continue anti-sinus therapy with claritin, flonase and afrin nasal spray. Follow.  Active Problems: Chest pain: describes more of a burning sensation-but apparently this has been occurring repeatedly over the past 2 months. He also describes palpitation when he has these episodes of chest pain. In the hospital most of these episodes of chest pain has been associated with A. fib RVR, suspected demand ischemia. Await nuclear stress test and 2-D echocardiogram. Cardiology following.  PAF RVR: Numerous episodes of  Afib/Aflutter with RVR and 4/20-currently in sinus rhythm-continue Lopressor to 100 mg BID, and  Cardizem 60 mg BID. CHADS2VASC of just 1. Currently on IV heparin until we obtain 2-D echocardiogram, if no significant structural abnormalities on 2-D echo, cards recommending aspirin instead of long-term anticoagulation. Note, free T4 and TSH within normal limits.   Addendum: Nuclear stress test negative, 2-D echocardiogram without major structural abnormalities-discontinue heparin infusion and start aspirin.  Elevated Lactic acid: suspect from hypoxia/sob/A. fib RVR-no evidence of sepsis or  hypovolumia.Belly is soft-doubt any further work up required.  WUJ:[id number] Metoprolol and Cardizem  Gout: stable-without any flare-continue Allopurinol  Tobacco Abuse:counseled.   DVT Prophylaxis: IV heparin  Code Status: Full code   Family Communication: None at bedside  Disposition Plan: Remain inpatient-home tomorrow  Antimicrobial agents: Rocephin 4/20>>4/20 Zithromax 4/20>>  Procedures: None  CONSULTS:  None  Time spent: 25 minutes-Greater than 50% of this time was spent in counseling, explanation of diagnosis, planning of further management, and coordination of care.  MEDICATIONS: Anti-infectives    Start     Dose/Rate Route Frequency Ordered Stop   09/10/15 1000  azithromycin (ZITHROMAX) tablet 250 mg     250 mg Oral Daily 09/09/15 0104 09/14/15 0959   09/09/15 1000  azithromycin (ZITHROMAX) tablet 500 mg     500 mg Oral Daily 09/09/15 0104 09/09/15 0850   09/09/15 0300  cefTRIAXone (ROCEPHIN) 1 g in dextrose 5 % 50 mL IVPB  Status:  Discontinued     1 g 100 mL/hr over 30 Minutes Intravenous Every 24 hours 09/09/15 0221 09/09/15 1244      Scheduled Meds: . allopurinol  100 mg Oral Daily  . atorvastatin  40 mg Oral q1800  . azithromycin  250 mg Oral Daily  . dextromethorphan-guaiFENesin  1 tablet Oral BID  . diltiazem  60 mg Oral Q8H  . fluticasone  2 spray Each Nare Daily  . furosemide  20 mg Intravenous Once  . ipratropium  0.5 mg Nebulization Q6H  . levalbuterol  1.25 mg Nebulization Q6H WA  . loratadine  10 mg Oral Daily  . methylPREDNISolone (SOLU-MEDROL) injection  40 mg Intravenous Q12H  . metoprolol tartrate  100  mg Oral BID  . nicotine  21 mg Transdermal Daily  . oxymetazoline  1 spray Each Nare BID  . pantoprazole  40 mg Oral Q1200  . regadenoson      . sodium chloride flush  3 mL Intravenous Q12H   Continuous Infusions: . heparin     PRN Meds:.acetaminophen **OR** acetaminophen, alum & mag hydroxide-simeth, benzonatate,  morphine injection, nitroGLYCERIN, ondansetron **OR** ondansetron (ZOFRAN) IV   PHYSICAL EXAM: Vital signs: Filed Vitals:   09/10/15 1000 09/10/15 1003 09/10/15 1005 09/10/15 1007  BP: 131/85 128/88 140/87 156/87  Pulse: 56 87 81 67  Temp:      TempSrc:      Resp:      Height:      Weight:      SpO2:       Filed Weights   09/09/15 0226  Weight: 83.7 kg (184 lb 8.4 oz)   Body mass index is 32.7 kg/(m^2).   Gen Exam: Awake and alert with clear speech. Not in any distress  Neck: Supple, No JVD.   Chest: Bilaterally clear without any rhonchi CVS: S1 S2 Regular, no murmurs.  Abdomen: soft, BS +, non tender, non distended.  Extremities: no edema, lower extremities warm to touch. Neurologic: Non Focal.   Skin: No Rash or lesions   Wounds: N/A.    LABORATORY DATA: CBC:  Recent Labs Lab 09/08/15 2030 09/09/15 0632 09/10/15 0057  WBC 8.9 9.8 13.5*  NEUTROABS 4.0  --   --   HGB 15.3 13.9 13.2  HCT 45.7 43.1 41.4  MCV 81.9 81.6 82.1  PLT 212 214 228    Basic Metabolic Panel:  Recent Labs Lab 09/08/15 2030 09/09/15 0632 09/10/15 0057  NA 138 139 138  K 4.0 4.5 4.2  CL 105 107 108  CO2 25 19* 19*  GLUCOSE 113* 160* 272*  BUN 16 12 21*  CREATININE 0.89 0.89 1.01  CALCIUM 9.8 8.9 9.0    GFR: Estimated Creatinine Clearance: 91.1 mL/min (by C-G formula based on Cr of 1.01).  Liver Function Tests:  Recent Labs Lab 09/09/15 [id number] 09/10/15 0057  AST 67* 43*  ALT 109* 85*  ALKPHOS 73 66  BILITOT 0.6 0.7  PROT 6.8 6.2*  ALBUMIN 3.3* 3.2*   No results for input(s): LIPASE, AMYLASE in the last 168 hours. No results for input(s): AMMONIA in the last 168 hours.  Coagulation Profile:  Recent Labs Lab 09/09/15 0125  INR 1.05    Cardiac Enzymes:  Recent Labs Lab 09/09/15 0125 09/09/15 0632 09/09/15 1330 09/09/15 1926 09/10/15 0057  TROPONINI <0.03 <0.03 <0.03 <0.03 <0.03    BNP (last 3 results) No results for input(s): PROBNP in the last 8760  hours.  HbA1C:  Recent Labs  09/09/15 0403  HGBA1C 6.1*    CBG: No results for input(s): GLUCAP in the last 168 hours.  Lipid Profile:  Recent Labs  09/09/15 0426  CHOL 206*  HDL 43  LDLCALC 140*  TRIG 116  CHOLHDL 4.8    Thyroid Function Tests:  Recent Labs  09/09/15 1829  TSH 0.519  FREET4 0.92    Anemia Panel: No results for input(s): VITAMINB12, FOLATE, FERRITIN, TIBC, IRON, RETICCTPCT in the last 72 hours.  Urine analysis: No results found for: COLORURINE, APPEARANCEUR, LABSPEC, PHURINE, GLUCOSEU, HGBUR, BILIRUBINUR, KETONESUR, PROTEINUR, UROBILINOGEN, NITRITE, LEUKOCYTESUR  Sepsis Labs: Lactic Acid, Venous    Component Value Date/Time   LATICACIDVEN 6.5* 09/09/2015 0403    MICROBIOLOGY: No results found for this or  any previous visit (from the past 240 hour(s)).  RADIOLOGY STUDIES/RESULTS: Dg Chest Port 1 View  09/08/2015  CLINICAL DATA:  Severe shortness of breath. EXAM: PORTABLE CHEST - 1 VIEW COMPARISON:  None. FINDINGS: The heart is mildly enlarged. This is exaggerated by low lung volumes. There is mild interstitial prominence. No effusions are present. There is no focal airspace disease. IMPRESSION: 1. Borderline cardiomegaly and mild diffuse interstitial pattern suggesting mild edema. 2. No focal airspace disease or effusions. Electronically Signed   By: Marin Roberts M.D.   On: 09/08/2015 21:03    LOS: 1 day   Jeoffrey Massed, MD  [facility] Pager:[phone]  If 7PM-7AM, please contact night-coverage [email or web address] Password [facility] 09/10/2015, 12:11 PM

## 2015-09-11 LAB — BASIC METABOLIC PANEL
Anion gap: 12 (ref 5–15)
BUN: 24 mg/dL — AB (ref 6–20)
CHLORIDE: 105 mmol/L (ref 101–111)
CO2: 21 mmol/L — AB (ref 22–32)
CREATININE: 0.94 mg/dL (ref 0.61–1.24)
Calcium: 8.7 mg/dL — ABNORMAL LOW (ref 8.9–10.3)
GFR calc Af Amer: >60 mL/min (ref >60)
GFR calc non Af Amer: >60 mL/min (ref >60)
GLUCOSE: 130 mg/dL — AB (ref 65–99)
Potassium: 3.7 mmol/L (ref 3.5–5.1)
SODIUM: 138 mmol/L (ref 135–145)

## 2015-09-11 LAB — CBC
HCT: 45.8 % (ref 39.0–52.0)
HEMOGLOBIN: 14.7 g/dL (ref 13.0–17.0)
MCH: 26.9 pg (ref 26.0–34.0)
MCHC: 32.1 g/dL (ref 30.0–36.0)
MCV: 83.7 fL (ref 78.0–100.0)
Platelets: 237 10*3/uL (ref 150–400)
RBC: 5.47 MIL/uL (ref 4.22–5.81)
RDW: 13.4 % (ref 11.5–15.5)
WBC: 15.8 10*3/uL — ABNORMAL HIGH (ref 4.0–10.5)

## 2015-09-11 MED ORDER — ASPIRIN 81 MG PO CHEW: 81.0000 mg | CHEWABLE_TABLET | Freq: Every day | ORAL | Status: DC

## 2015-09-11 MED ORDER — PANTOPRAZOLE SODIUM 40 MG PO TBEC: 40.0000 mg | DELAYED_RELEASE_TABLET | Freq: Every day | ORAL | Status: DC

## 2015-09-11 MED ORDER — METOPROLOL TARTRATE 100 MG PO TABS: 100.0000 mg | ORAL_TABLET | Freq: Two times a day (BID) | ORAL | Status: DC

## 2015-09-11 MED ORDER — ALBUTEROL SULFATE HFA 108 (90 BASE) MCG/ACT IN AERS: 2.0000 | INHALATION_SPRAY | Freq: Four times a day (QID) | RESPIRATORY_TRACT | Status: DC | PRN

## 2015-09-11 MED ORDER — DILTIAZEM HCL ER COATED BEADS 120 MG PO CP24: 120.0000 mg | ORAL_CAPSULE | Freq: Every day | ORAL | Status: DC

## 2015-09-11 MED ORDER — DILTIAZEM HCL ER COATED BEADS 120 MG PO CP24
120.0000 mg | ORAL_CAPSULE | Freq: Every day | ORAL | Status: DC
  Administered 2015-09-11: 120 mg via ORAL
  Filled 2015-09-11: qty 1

## 2015-09-11 MED ORDER — ATORVASTATIN CALCIUM 40 MG PO TABS: 40.0000 mg | ORAL_TABLET | Freq: Every day | ORAL | Status: DC

## 2015-09-11 MED ORDER — FLUTICASONE PROPIONATE 50 MCG/ACT NA SUSP: 2.0000 | Freq: Every day | NASAL | Status: DC

## 2015-09-11 MED ORDER — BENZONATATE 200 MG PO CAPS: 200.0000 mg | ORAL_CAPSULE | Freq: Three times a day (TID) | ORAL | Status: DC | PRN

## 2015-09-11 MED ORDER — PREDNISONE 10 MG PO TABS: ORAL_TABLET | ORAL | Status: DC

## 2015-09-11 NOTE — Progress Notes (Signed)
All respiratory lab work negative. Droplet precautions discontinued at this time.  Thanks, Lyanne Coonya Jolette Lana, RN

## 2015-09-11 NOTE — Discharge Summary (Signed)
PATIENT DETAILS Name: Billy OlpSunil Kaupp Age: 45 y.o. Sex: male Date of Birth: [birth year] MRN: [id number]. Admitting Physician: Lorretta HarpXilin Niu, MD WUJ:[id number]:Hommel, Gregary SignsSean, DO  Admit Date: 09/08/2015 Discharge date: 09/11/2015  Recommendations for Outpatient Follow-up:  1. New diagnoses-proximal atrial fibrillation-on aspirin  2. New medication: Cardizem, Protonix, as needed albuterol inhaler  PRIMARY DISCHARGE DIAGNOSIS:  Principal Problem:   Bronchospasm, acute Active Problems:   HTN (hypertension)   Obesity   Smoker   Gout   Prediabetes   Chest pain   Sepsis (HCC)   Bronchospasm   Acute diastolic heart failure (HCC)   Atrial fibrillation with rapid ventricular response (HCC)      PAST MEDICAL HISTORY: Past Medical History  Diagnosis Date  . Hypertension   . Gout     DISCHARGE MEDICATIONS: Current Discharge Medication List    START taking these medications   Details  albuterol (PROVENTIL HFA;VENTOLIN HFA) 108 (90 Base) MCG/ACT inhaler Inhale 2 puffs into the lungs every 6 (six) hours as needed for wheezing or shortness of breath. Qty: 1 Inhaler, Refills: 2    aspirin 81 MG chewable tablet Chew 1 tablet (81 mg total) by mouth daily.    atorvastatin (LIPITOR) 40 MG tablet Take 1 tablet (40 mg total) by mouth daily at 6 PM. Qty: 30 tablet, Refills: 0    benzonatate (TESSALON) 200 MG capsule Take 1 capsule (200 mg total) by mouth 3 (three) times daily as needed for cough. Qty: 20 capsule, Refills: 0    diltiazem (CARDIZEM CD) 120 MG 24 hr capsule Take 1 capsule (120 mg total) by mouth daily. Qty: 30 capsule, Refills: 0    fluticasone (FLONASE) 50 MCG/ACT nasal spray Place 2 sprays into both nostrils daily. Qty: 16 g, Refills: 0    pantoprazole (PROTONIX) 40 MG tablet Take 1 tablet (40 mg total) by mouth daily at 12 noon. Qty: 30 tablet, Refills: 0    predniSONE (DELTASONE) 10 MG tablet Take 4 tablets (40 mg) daily for 2 days, then, Take 3 tablets (30 mg) daily for 2  days, then, Take 2 tablets (20 mg) daily for 2 days, then, Take 1 tablets (10 mg) daily for 1 days, then stop Qty: 19 tablet, Refills: 0      CONTINUE these medications which have CHANGED   Details  metoprolol (LOPRESSOR) 100 MG tablet Take 1 tablet (100 mg total) by mouth 2 (two) times daily. Qty: 60 tablet, Refills: 0      CONTINUE these medications which have NOT CHANGED   Details  allopurinol (ZYLOPRIM) 100 MG tablet Take 1 tablet (100 mg total) by mouth daily. Qty: 90 tablet, Refills: 1   Associated Diagnoses: Idiopathic chronic gout of left ankle without tophus    loratadine (CLARITIN) 10 MG tablet Take 10 mg by mouth daily.    Icosapent Ethyl (VASCEPA) 1 g CAPS Two by mouth twice a day taken at mealtime. Qty: 120 capsule, Refills: 11        ALLERGIES:  No Known Allergies  BRIEF HPI:  See H&P, Labs, Consult and Test reports for all details in brief, Patient is a 45 y.o. male with hx of allergies and htn-presented with several day hx of cough and shortness of breath.Thought to have acute bronchitis and admitted for further evaluation and treatment.   CONSULTATIONS:   cardiology  PERTINENT RADIOLOGIC STUDIES: Nm Myocar Multi W/spect W/wall Motion / Ef  09/10/2015   There was no ST segment deviation noted during stress.  The study is normal.  This is a low risk study. No ischemia.  Donato Schultz, MD   Dg Chest Port 1 View  09/08/2015  CLINICAL DATA:  Severe shortness of breath. EXAM: PORTABLE CHEST - 1 VIEW COMPARISON:  None. FINDINGS: The heart is mildly enlarged. This is exaggerated by low lung volumes. There is mild interstitial prominence. No effusions are present. There is no focal airspace disease. IMPRESSION: 1. Borderline cardiomegaly and mild diffuse interstitial pattern suggesting mild edema. 2. No focal airspace disease or effusions. Electronically Signed   By: Billy Roberts M.D.   On: 09/08/2015 21:03     PERTINENT LAB RESULTS: CBC:  Recent  Labs  09/10/15 0057 09/11/15 0554  WBC 13.5* 15.8*  HGB 13.2 14.7  HCT 41.4 45.8  PLT 228 237   CMET CMP     Component Value Date/Time   NA 138 09/11/2015 0554   K 3.7 09/11/2015 0554   CL 105 09/11/2015 0554   CO2 21* 09/11/2015 0554   GLUCOSE 130* 09/11/2015 0554   BUN 24* 09/11/2015 0554   CREATININE 0.94 09/11/2015 0554   CREATININE 0.86 08/20/2015 1556   CALCIUM 8.7* 09/11/2015 0554   PROT 6.2* 09/10/2015 0057   ALBUMIN 3.2* 09/10/2015 0057   AST 43* 09/10/2015 0057   ALT 85* 09/10/2015 0057   ALKPHOS 66 09/10/2015 0057   BILITOT 0.7 09/10/2015 0057   GFRNONAA >60 09/11/2015 0554   GFRNONAA >89 08/20/2015 1556   GFRAA >60 09/11/2015 0554   GFRAA >89 08/20/2015 1556    GFR Estimated Creatinine Clearance: 97.9 mL/min (by C-G formula based on Cr of 0.94). No results for input(s): LIPASE, AMYLASE in the last 72 hours.  Recent Labs  09/09/15 1330 09/09/15 1926 09/10/15 0057  TROPONINI <0.03 <0.03 <0.03   Invalid input(s): POCBNP  Recent Labs  09/09/15 0125  DDIMER <0.27    Recent Labs  09/09/15 0403  HGBA1C 6.1*    Recent Labs  09/09/15 0426  CHOL 206*  HDL 43  LDLCALC 140*  TRIG 116  CHOLHDL 4.8    Recent Labs  09/09/15 1829  TSH 0.519   No results for input(s): VITAMINB12, FOLATE, FERRITIN, TIBC, IRON, RETICCTPCT in the last 72 hours. Coags:  Recent Labs  09/09/15 0125  INR 1.05   Microbiology: Recent Results (from the past 240 hour(s))  Respiratory virus panel     Status: None   Collection Time: 09/09/15  1:42 AM  Result Value Ref Range Status   Respiratory Syncytial Virus A Negative Negative Final   Respiratory Syncytial Virus B Negative Negative Final   Influenza A Negative Negative Final   Influenza B Negative Negative Final   Parainfluenza 1 Negative Negative Final   Parainfluenza 2 Negative Negative Final   Parainfluenza 3 Negative Negative Final   Metapneumovirus Negative Negative Final   Rhinovirus Negative  Negative Final   Adenovirus Negative Negative Final    Comment: (NOTE) Performed At: [facility] [address] [place], [place] [id number] Mila Homer MD [id number]   Culture, blood (x 2)     Status: None (Preliminary result)   Collection Time: 09/09/15  4:16 AM  Result Value Ref Range Status   Specimen Description BLOOD LEFT ARM  Final   Special Requests IN PEDIATRIC BOTTLE  Final   Culture NO GROWTH 2 DAYS  Final   Report Status PENDING  Incomplete  Culture, blood (x 2)     Status: None (Preliminary result)   Collection Time: 09/09/15  4:20 AM  Result Value  Ref Range Status   Specimen Description BLOOD LEFT HAND  Final   Special Requests BOTTLES DRAWN AEROBIC AND ANAEROBIC  Final   Culture NO GROWTH 2 DAYS  Final   Report Status PENDING  Incomplete     BRIEF HOSPITAL COURSE:  Acute Bronchitis: Significantly improved, no rhonchi on exam today. Initially on IV Solumedrol-has now been transitioned to prednisone. Completed 3 day course of Zithromax-does not need Abx on discharge. Continue prn Albuterol.Note influenza and resp virus panel was negative.  Active Problems: Chest pain: resolved. Troponin were negative. D Dimer and Lower Ext doppler were negative. Seen by cardiology, underwent Nuc stress test-negative for ischemia. ?GI etiology-continue PPI on discharge. If recurs would need outpatient GI work up.  PAF RVR: Numerous episodes of Afib/Aflutter with RVR during initial hospital stay-but now in sinus rhythm.Continue Metoprolol and cardizem on discharge. CHADS2VASC of just 1-hence will be on ASA.  2-D echocardiogram with no significant structural abnormalities. Note, free T4 and TSH within normal limits.   Elevated Lactic acid: suspect from hypoxia/sob/A. fib RVR-no evidence of sepsis or hypovolumia.Belly is soft-doubt any further work up required.  ZOX:[id number] Metoprolol and Cardizem  Gout: stable-without any flare-continue  Allopurinol  Tobacco Abuse:counseled.   TODAY-DAY OF DISCHARGE:  Subjective:   Elisha Zelek today has no headache,no chest abdominal pain,no new weakness tingling or numbness, feels much better wants to go home today.   Objective:   Blood pressure 137/89, pulse 56, temperature 97.5 F (36.4 C), temperature source Oral, resp. rate 20, height 5\' 3"  (1.6 m), weight 83.7 kg (184 lb 8.4 oz), SpO2 95 %.  Intake/Output Summary (Last 24 hours) at 09/11/15 1214 Last data filed at 09/11/15 0950  Gross per 24 hour  Intake      0 ml  Output      0 ml  Net      0 ml   Filed Weights   09/09/15 0226  Weight: 83.7 kg (184 lb 8.4 oz)    Exam Awake Alert, Oriented *3, No new F.N deficits, Normal affect [place].AT,PERRAL Supple Neck,No JVD, No cervical lymphadenopathy appriciated.  Symmetrical Chest wall movement, Good air movement bilaterally, CTAB RRR,No Gallops,Rubs or new Murmurs, No Parasternal Heave +ve B.Sounds, Abd Soft, Non tender, No organomegaly appriciated, No rebound -guarding or rigidity. No Cyanosis, Clubbing or edema, No new Rash or bruise  DISCHARGE CONDITION: Stable  DISPOSITION: Home  DISCHARGE INSTRUCTIONS:    Activity:  As tolerated   Get Medicines reviewed and adjusted: Please take all your medications with you for your next visit with your Primary MD  Please request your Primary MD to go over all hospital tests and procedure/radiological results at the follow up, please ask your Primary MD to get all Hospital records sent to his/her office.  If you experience worsening of your admission symptoms, develop shortness of breath, life threatening emergency, suicidal or homicidal thoughts you must seek medical attention immediately by calling 911 or calling your MD immediately  if symptoms less severe.  You must read complete instructions/literature along with all the possible adverse reactions/side effects for all the Medicines you take and that have been prescribed to  you. Take any new Medicines after you have completely understood and accpet all the possible adverse reactions/side effects.   Do not drive when taking Pain medications.   Do not take more than prescribed Pain, Sleep and Anxiety Medications  Special Instructions: If you have smoked or chewed Tobacco  in the last 2 yrs please stop smoking,  stop any regular Alcohol  and or any Recreational drug use.  Wear Seat belts while driving.  Please note  You were cared for by a hospitalist during your hospital stay. Once you are discharged, your primary care physician will handle any further medical issues. Please note that NO REFILLS for any discharge medications will be authorized once you are discharged, as it is imperative that you return to your primary care physician (or establish a relationship with a primary care physician if you do not have one) for your aftercare needs so that they can reassess your need for medications and monitor your lab values.  Diet recommendation: Heart Healthy diet  Discharge Instructions    Call MD for:  extreme fatigue    Complete by:  As directed      Call MD for:  severe uncontrolled pain    Complete by:  As directed      Diet - low sodium heart healthy    Complete by:  As directed      Increase activity slowly    Complete by:  As directed            Follow-up Information    Follow up with Laren Boom, DO. Schedule an appointment as soon as possible for a visit in 1 week.   Specialty:  Family Medicine   Why:  Hospital follow up   Contact information:   [address] [place] [address] [phone]      Total Time spent on discharge equals 45 minutes.  SignedJeoffrey Massed 09/11/2015 12:14 PM

## 2015-09-14 LAB — CULTURE, BLOOD (ROUTINE X 2)
CULTURE: NO GROWTH
CULTURE: NO GROWTH

## 2015-11-05 ENCOUNTER — Encounter: Payer: Self-pay | Admitting: Emergency Medicine

## 2015-11-05 ENCOUNTER — Emergency Department (INDEPENDENT_AMBULATORY_CARE_PROVIDER_SITE_OTHER)
Admission: EM | Admit: 2015-11-05 | Discharge: 2015-11-05 | Disposition: A | Discharge status: Home / Self Care | Payer: 59 | Source: Home / Self Care | Attending: Family Medicine | Admitting: Family Medicine

## 2015-11-05 DIAGNOSIS — K219 Gastro-esophageal reflux disease without esophagitis: Secondary | ICD-10-CM

## 2015-11-05 DIAGNOSIS — R030 Elevated blood-pressure reading, without diagnosis of hypertension: Secondary | ICD-10-CM

## 2015-11-05 DIAGNOSIS — R079 Chest pain, unspecified: Secondary | ICD-10-CM

## 2015-11-05 DIAGNOSIS — R001 Bradycardia, unspecified: Secondary | ICD-10-CM

## 2015-11-05 DIAGNOSIS — IMO0001 Reserved for inherently not codable concepts without codable children: Secondary | ICD-10-CM

## 2015-11-05 MED ORDER — OMEPRAZOLE 20 MG PO CPDR: 20.0000 mg | DELAYED_RELEASE_CAPSULE | Freq: Every day | ORAL | Status: DC

## 2015-11-05 NOTE — ED Provider Notes (Signed)
CSN: [id number]     Arrival date & time 11/05/15  1125 History   First MD Initiated Contact with Patient 11/05/15 1129     Chief Complaint  Patient presents with  . Chest Pain   (Consider location/radiation/quality/duration/timing/severity/associated sxs/prior Treatment) HPI Billy Beard is a 45 y.o. male presenting to UC with c/o burning centralized chest pain that has been coming and going since yesterday. Pain is worse when lying down but denies SOB.  He has not tried anything for his symptoms but notes he was hospitalized about 1 month ago and dx with acid reflux. He completed that course of medication but believes the pain came back because he needs only on blood pressure medications at this time. He has f/u with his PCP next week but wanted to make sure his chest pain was not due to his heart. Denies dizziness, fever, chills, nausea, diaphoresis or palpitations.     Past Medical History  Diagnosis Date  . Hypertension   . Gout    History reviewed. No pertinent past surgical history. Family History  Problem Relation Age of Onset  . Heart disease Mother   . Gout Father    Social History  Substance Use Topics  . Smoking status: Current Every Day Smoker -- 0.25 packs/day    Types: Cigarettes  . Smokeless tobacco: None  . Alcohol Use: No    Review of Systems  Constitutional: Negative for fever and chills.  HENT: Negative for congestion, ear pain, sore throat, trouble swallowing and voice change.   Respiratory: Negative for cough and shortness of breath.   Cardiovascular: Positive for chest pain. Negative for palpitations.  Gastrointestinal: Negative for nausea, vomiting, abdominal pain and diarrhea.  Musculoskeletal: Negative for myalgias, back pain and arthralgias.  Skin: Negative for rash.    Allergies  Review of patient's allergies indicates no known allergies.  Home Medications   Prior to Admission medications   Medication Sig Start Date End Date Taking?  Authorizing Provider  albuterol (PROVENTIL HFA;VENTOLIN HFA) 108 (90 Base) MCG/ACT inhaler Inhale 2 puffs into the lungs every 6 (six) hours as needed for wheezing or shortness of breath. 09/11/15   Maretta BeesShanker M Ghimire, MD  allopurinol (ZYLOPRIM) 100 MG tablet Take 1 tablet (100 mg total) by mouth daily. 08/06/15   Laren BoomSean Hommel, DO  aspirin 81 MG chewable tablet Chew 1 tablet (81 mg total) by mouth daily. 09/11/15   Shanker Levora DredgeM Ghimire, MD  atorvastatin (LIPITOR) 40 MG tablet Take 1 tablet (40 mg total) by mouth daily at 6 PM. 09/11/15   Shanker Levora DredgeM Ghimire, MD  benzonatate (TESSALON) 200 MG capsule Take 1 capsule (200 mg total) by mouth 3 (three) times daily as needed for cough. 09/11/15   Shanker Levora DredgeM Ghimire, MD  diltiazem (CARDIZEM CD) 120 MG 24 hr capsule Take 1 capsule (120 mg total) by mouth daily. 09/11/15   Shanker Levora DredgeM Ghimire, MD  fluticasone (FLONASE) 50 MCG/ACT nasal spray Place 2 sprays into both nostrils daily. 09/11/15   Shanker Levora DredgeM Ghimire, MD  Icosapent Ethyl (VASCEPA) 1 g CAPS Two by mouth twice a day taken at mealtime. Patient taking differently: Take 2 g by mouth 2 (two) times daily. Two by mouth twice a day taken at mealtime. 08/23/15   Sean Hommel, DO  loratadine (CLARITIN) 10 MG tablet Take 10 mg by mouth daily.    Historical Provider, MD  metoprolol (LOPRESSOR) 100 MG tablet Take 1 tablet (100 mg total) by mouth 2 (two) times daily. 09/11/15   Shanker  Levora Dredge, MD  omeprazole (PRILOSEC) 20 MG capsule Take 1 capsule (20 mg total) by mouth daily. 11/05/15   Junius Finner, PA-C  pantoprazole (PROTONIX) 40 MG tablet Take 1 tablet (40 mg total) by mouth daily at 12 noon. 09/11/15   Shanker Levora Dredge, MD  predniSONE (DELTASONE) 10 MG tablet Take 4 tablets (40 mg) daily for 2 days, then, Take 3 tablets (30 mg) daily for 2 days, then, Take 2 tablets (20 mg) daily for 2 days, then, Take 1 tablets (10 mg) daily for 1 days, then stop 09/11/15   Maretta Bees, MD   Meds Ordered and Administered this Visit   Medications - No data to display  BP 157/97 mmHg  Pulse 50  Temp(Src) 98 F (36.7 C) (Oral)  Ht  (1.6 m)  Wt 187 lb (84.823 kg)  BMI 33.13 kg/m2  SpO2 97% No data found.   Physical Exam  Constitutional: He appears well-developed and well-nourished. No distress.  HENT:  Head: Normocephalic and atraumatic.  Right Ear: Tympanic membrane normal.  Left Ear: Tympanic membrane normal.  Nose: Nose normal.  Mouth/Throat: Uvula is midline, oropharynx is clear and moist and mucous membranes are normal.  Eyes: Conjunctivae are normal. No scleral icterus.  Neck: Normal range of motion. Neck supple.  Cardiovascular: Normal rate, regular rhythm and normal heart sounds.   Pulmonary/Chest: Effort normal and breath sounds normal. No respiratory distress. He has no wheezes. He has no rales. He exhibits no tenderness.  Abdominal: Soft. He exhibits no distension. There is no tenderness.  Musculoskeletal: Normal range of motion.  Neurological: He is alert.  Skin: Skin is warm and dry. He is not diaphoretic.  Nursing note and vitals reviewed.   ED Course  Procedures (including critical care time)  Labs Review Labs Reviewed - No data to display  Imaging Review No results found.  Date/Time:11/05/15     11:38:17   Ventricular Rate: 52 PR Interval: 148 QRS Duration: 104 QT Interval: 422 QTC Calculation: 408 P-QRS-T: 44/35/43 Text Interpretation: Sinus bradycardia, within normal limits     MDM   1. Chest pain, unspecified chest pain type   2. Bradycardia   3. Gastroesophageal reflux disease, esophagitis presence not specified   4. Elevated blood pressure    Pt c/o centralized chest pain that is described as a burning sensation, worse when lying down.  Discharged summary from most recent admission reviewed.  Pt had full cardiac workup, was found to have intermittent afib, however, CP was believed to be related to GI symptoms.  Pt was on omeprazole  daily for 4 weeks  with no refills, advised to f/u with PCP or GI.    EKG: sinus bradycardia, pt denies palpitations, SOB, dizziness or headache. Bradycardia expected as pt is on metoprolol and diltiazem from most recent admission.  BP initially elevated at 165/104, recheck- 157/97  Symptoms most likely due to acid reflux. Rx: omeprazole  daily for 14 days (or until f/u with PCP next wee) Discussed symptoms that warrant emergent care in the ED.    Junius Finner, PA-C 11/05/15 1406

## 2015-11-05 NOTE — ED Notes (Signed)
Chest pain, burning started yesterday

## 2015-11-05 NOTE — Discharge Instructions (Signed)
Please be sure to continue taking your blood pressure medications as prescribed and be sure to follow up with your family doctor next week as previously scheduled.

## 2015-11-09 ENCOUNTER — Ambulatory Visit (INDEPENDENT_AMBULATORY_CARE_PROVIDER_SITE_OTHER): Payer: 59 | Admitting: Family Medicine

## 2015-11-09 ENCOUNTER — Encounter: Payer: Self-pay | Admitting: Family Medicine

## 2015-11-09 DIAGNOSIS — Z0289 Encounter for other administrative examinations: Secondary | ICD-10-CM

## 2015-11-09 NOTE — Progress Notes (Signed)
No show 11/09/2015

## 2015-11-12 ENCOUNTER — Encounter: Payer: Self-pay | Admitting: Family Medicine

## 2015-11-12 ENCOUNTER — Ambulatory Visit (INDEPENDENT_AMBULATORY_CARE_PROVIDER_SITE_OTHER): Payer: 59 | Admitting: Family Medicine

## 2015-11-12 DIAGNOSIS — Z0289 Encounter for other administrative examinations: Secondary | ICD-10-CM

## 2015-11-12 NOTE — Progress Notes (Signed)
No show 11/12/2015

## 2015-12-06 ENCOUNTER — Other Ambulatory Visit: Payer: Self-pay

## 2015-12-06 DIAGNOSIS — M1A0720 Idiopathic chronic gout, left ankle and foot, without tophus (tophi): Secondary | ICD-10-CM

## 2015-12-06 MED ORDER — ALLOPURINOL 100 MG PO TABS: 100.0000 mg | ORAL_TABLET | Freq: Every day | ORAL | Status: DC

## 2016-02-01 ENCOUNTER — Ambulatory Visit (INDEPENDENT_AMBULATORY_CARE_PROVIDER_SITE_OTHER): Payer: Managed Care, Other (non HMO) | Admitting: Family Medicine

## 2016-02-01 VITALS — BP 144/86 | HR 51 | Temp 98.2°F | Wt 188.0 lb

## 2016-02-01 DIAGNOSIS — E781 Pure hyperglyceridemia: Secondary | ICD-10-CM | POA: Diagnosis not present

## 2016-02-01 DIAGNOSIS — R21 Rash and other nonspecific skin eruption: Secondary | ICD-10-CM | POA: Diagnosis not present

## 2016-02-01 DIAGNOSIS — I480 Paroxysmal atrial fibrillation: Secondary | ICD-10-CM

## 2016-02-01 DIAGNOSIS — I10 Essential (primary) hypertension: Secondary | ICD-10-CM

## 2016-02-01 DIAGNOSIS — M1A0720 Idiopathic chronic gout, left ankle and foot, without tophus (tophi): Secondary | ICD-10-CM

## 2016-02-01 DIAGNOSIS — M109 Gout, unspecified: Secondary | ICD-10-CM

## 2016-02-01 DIAGNOSIS — R7303 Prediabetes: Secondary | ICD-10-CM

## 2016-02-01 MED ORDER — TRIAMCINOLONE ACETONIDE 0.5 % EX CREA: 1.0000 "application " | TOPICAL_CREAM | Freq: Two times a day (BID) | CUTANEOUS | 3 refills | Status: DC

## 2016-02-01 MED ORDER — ALLOPURINOL 100 MG PO TABS: 100.0000 mg | ORAL_TABLET | Freq: Every day | ORAL | 0 refills | Status: DC

## 2016-02-01 MED ORDER — ATORVASTATIN CALCIUM 40 MG PO TABS: 40.0000 mg | ORAL_TABLET | Freq: Every day | ORAL | 2 refills | Status: DC

## 2016-02-01 NOTE — Progress Notes (Signed)
       Marin OlpSunil Buccieri is a 45 y.o. male who [facility] today for rash. Patient is a rash in his left leg has been present for about a week. He cannot recall any specific exposures new medications of detergents or shampoos fevers or chills. He's used some over-the-counter hydrocortisone cream that is helped a bit. He notes that he has run out of his Lipitor and allopurinol.   Past Medical History:  Diagnosis Date  . Gout   . Hypertension    No past surgical history on file. Social History  Substance Use Topics  . Smoking status: Current Every Day Smoker    Packs/day: 0.25    Types: Cigarettes  . Smokeless tobacco: Not on file  . Alcohol use No   family history includes Gout in his father; Heart disease in his mother.  ROS as above:  Medications: Current Outpatient Prescriptions  Medication Sig Dispense Refill  . metoprolol (LOPRESSOR) 100 MG tablet Take 1 tablet (100 mg total) by mouth 2 (two) times daily. 60 tablet 0  . atorvastatin (LIPITOR) 40 MG tablet Take 1 tablet (40 mg total) by mouth daily at 6 PM. 30 tablet 2  . triamcinolone cream (KENALOG) 0.5 % Apply 1 application topically 2 (two) times daily. To affected areas. 30 g 3   No current facility-administered medications for this visit.    No Known Allergies   Exam:  BP (!) 144/86   Pulse (!) 51   Temp 98.2 F (36.8 C) (Oral)   Wt 188 lb (85.3 kg)   SpO2 97%   BMI 33.30 kg/m  Gen: Well NAD HEENT: EOMI,  MMM Lungs: Normal work of breathing. CTABL Heart: Bradycardia but regular no MRG Abd: NABS, Soft. Nondistended, Nontender Exts: Brisk capillary refill, warm and well perfused.  Skin: Maculopapular erythematous mildly blanching rash left leg. Nontender.  No results found for this or any previous visit (from the past 24 hour(s)). No results found.    Assessment and Plan: 45 y.o.  male with rash. Unclear etiology. Suspect contact or irritant dermatitis. Treat with triamcinolone cream. Hyperlipidemia: Check basic fasting labs. Gout: Continue allopurinol. Recheck in 2 weeks.    Orders Placed This Encounter  Procedures  . CBC  . Comprehensive metabolic panel    Order Specific Question:   Has the patient fasted?    Answer:   No  . Lipid panel    Order Specific Question:   Has the patient fasted?    Answer:   No  . Hemoglobin A1c  . TSH    Discussed warning signs or symptoms. Please see discharge instructions. Patient expresses understanding.

## 2016-02-01 NOTE — Patient Instructions (Signed)
Thank you for coming in today. Get fasting labs.  Return in 2 weeks.  Apply the cream to the leg.    Rash A rash is a change in the color or texture of the skin. There are many different types of rashes. You may have other problems that accompany your rash. CAUSES   Infections.  Allergic reactions. This can include allergies to pets or foods.  Certain medicines.  Exposure to certain chemicals, soaps, or cosmetics.  Heat.  Exposure to poisonous plants.  Tumors, both cancerous and noncancerous. SYMPTOMS   Redness.  Scaly skin.  Itchy skin.  Dry or cracked skin.  Bumps.  Blisters.  Pain. DIAGNOSIS  Your caregiver may do a physical exam to determine what type of rash you have. A skin sample (biopsy) may be taken and examined under a microscope. TREATMENT  Treatment depends on the type of rash you have. Your caregiver may prescribe certain medicines. For serious conditions, you may need to see a skin doctor (dermatologist). HOME CARE INSTRUCTIONS   Avoid the substance that caused your rash.  Do not scratch your rash. This can cause infection.  You may take cool baths to help stop itching.  Only take over-the-counter or prescription medicines as directed by your caregiver.  Keep all follow-up appointments as directed by your caregiver. SEEK IMMEDIATE MEDICAL CARE IF:  You have increasing pain, swelling, or redness.  You have a fever.  You have new or severe symptoms.  You have body aches, diarrhea, or vomiting.  Your rash is not better after 3 days. MAKE SURE YOU:  Understand these instructions.  Will watch your condition.  Will get help right away if you are not doing well or get worse.   This information is not intended to replace advice given to you by your health care provider. Make sure you discuss any questions you have with your health care provider.   Document Released: 04/28/2002 Document Revised: 05/29/2014 Document Reviewed:  09/23/2014 Elsevier Interactive Patient Education [employer]2016 [employer]

## 2016-06-20 ENCOUNTER — Other Ambulatory Visit: Payer: Self-pay | Admitting: Family Medicine

## 2016-08-28 ENCOUNTER — Ambulatory Visit (INDEPENDENT_AMBULATORY_CARE_PROVIDER_SITE_OTHER): Payer: BLUE CROSS/BLUE SHIELD | Admitting: Family Medicine

## 2016-08-28 ENCOUNTER — Ambulatory Visit (INDEPENDENT_AMBULATORY_CARE_PROVIDER_SITE_OTHER): Payer: BLUE CROSS/BLUE SHIELD

## 2016-08-28 VITALS — BP 159/101 | HR 53 | Wt 192.0 lb

## 2016-08-28 DIAGNOSIS — R0789 Other chest pain: Secondary | ICD-10-CM | POA: Diagnosis not present

## 2016-08-28 MED ORDER — ALLOPURINOL 100 MG PO TABS: 100.0000 mg | ORAL_TABLET | Freq: Every day | ORAL | 6 refills | Status: DC

## 2016-08-28 MED ORDER — METOPROLOL TARTRATE 100 MG PO TABS: 100.0000 mg | ORAL_TABLET | Freq: Two times a day (BID) | ORAL | 3 refills | Status: DC

## 2016-08-28 NOTE — Patient Instructions (Signed)
Thank you for coming in today. Get xray today downstaitrs.  You should hear form the cardiologist soon.  Return in 1 month.  We will get labs then.  Continue medicines.  Call or go to the emergency room if you get worse, have trouble breathing, have chest pains, or palpitations.    Chest Wall Pain Chest wall pain is pain in or around the bones and muscles of your chest. Sometimes, an injury causes this pain. Sometimes, the cause may not be known. This pain may take several weeks or longer to get better. Follow these instructions at home: Pay attention to any changes in your symptoms. Take these actions to help with your pain:  Rest as told by your doctor.  Avoid activities that cause pain. Try not to use your chest, belly (abdominal), or side muscles to lift heavy things.  If directed, apply ice to the painful area:  Put ice in a plastic bag.  Place a towel between your skin and the bag.  Leave the ice on for 20 minutes, 2-3 times per day.  Take over-the-counter and prescription medicines only as told by your doctor.  Do not use tobacco products, including cigarettes, chewing tobacco, and e-cigarettes. If you need help quitting, ask your doctor.  Keep all follow-up visits as told by your doctor. This is important. Contact a doctor if:  You have a fever.  Your chest pain gets worse.  You have new symptoms. Get help right away if:  You feel sick to your stomach (nauseous) or you throw up (vomit).  You feel sweaty or light-headed.  You have a cough with phlegm (sputum) or you cough up blood.  You are short of breath. This information is not intended to replace advice given to you by your health care provider. Make sure you discuss any questions you have with your health care provider. Document Released: 10/25/2007 Document Revised: 10/14/2015 Document Reviewed: 08/03/2014 Elsevier Interactive Patient Education  2017 [employer].

## 2016-08-28 NOTE — Progress Notes (Signed)
Billy Beard is a 46 y.o. male who presents to [name]: [facility] today for chest pain. Patient developed anterior chest pain a few days ago after doing a lot of coughing. He notes pain at the anterior aspect of his chest this worse with cough and deep inspiration and upper extremity motion. He denies any radiating pain weakness or numbness or palpitations. He has a history of poorly controlled hypertension and partial atrial fibrillation. He has been poorly compliant previously. He notes that he continues to take metoprolol and allopurinol..    Past Medical History:  Diagnosis Date  . Gout   . Hypertension    No past surgical history on file. Social History  Substance Use Topics  . Smoking status: Current Every Day Smoker    Packs/day: 0.25    Types: Cigarettes  . Smokeless tobacco: Not on file  . Alcohol use No   family history includes Gout in his father; Heart disease in his mother.  ROS as above:  Medications: Current Outpatient Prescriptions  Medication Sig Dispense Refill  . atorvastatin (LIPITOR) 40 MG tablet Take 1 tablet (40 mg total) by mouth daily at 6 PM. NEED FOLLOW UP APPOINTMENT FOR MORE REFILLS 30 tablet 0  . metoprolol (LOPRESSOR) 100 MG tablet Take 1 tablet (100 mg total) by mouth 2 (two) times daily. 60 tablet 3  . triamcinolone cream (KENALOG) 0.5 % Apply 1 application topically 2 (two) times daily. To affected areas. 30 g 3  . allopurinol (ZYLOPRIM) 100 MG tablet Take 1 tablet (100 mg total) by mouth daily. 30 tablet 6   No current facility-administered medications for this visit.    No Known Allergies  Health Maintenance Health Maintenance  Topic Date Due  . INFLUENZA VACCINE  12/20/2016  . TETANUS/TDAP  05/22/2017  . HIV Screening  Completed     Exam:  BP (!) 159/101   Pulse (!) 53   Wt 192 lb (87.1 kg)   BMI 34.01 kg/m  Gen:  Well NAD HEENT: EOMI,  MMM Lungs: Normal work of breathing. CTABL Chest wall: Tender to palpation left anterior chest wall Heart: Bradycardia but regularno MRG Abd: NABS, Soft. Nondistended, Nontender Exts: Brisk capillary refill, warm and well perfused.   CXR pending.   Twelve-lead EKG shows sinus bradycardia at 50 bpm. No ST segment elevation or depression. LVH pattern present. Otherwise normal EKG.  No results found for this or any previous visit (from the past 72 hour(s)). No results found.    Assessment and Plan: 46 y.o. male with  Chest pain very likely costochondritis. Patient is nonexertional. However he has been poorly compliant previously with his hypertensive regimen. Plan for echocardiogram chest x-ray refer to cardiology for further risk factor stratification likely stress test.  F/u in 1 month or less.    Orders Placed This Encounter  Procedures  . DG Chest 2 View    Order Specific Question:   Reason for exam:    Answer:   Cough, assess intra-thoracic pathology    Order Specific Question:   Preferred imaging location?    Answer:   [facility]  . Ambulatory referral to Cardiology    Referral Priority:   Routine    Referral Type:   Consultation    Referral Reason:   Specialty Services Required    Requested Specialty:   Cardiology    Number of Visits Requested:   1  . EKG 12-Lead   Meds ordered  this encounter  Medications  . allopurinol (ZYLOPRIM) 100 MG tablet    Sig: Take 1 tablet (100 mg total) by mouth daily.    Dispense:  30 tablet    Refill:  6  . metoprolol (LOPRESSOR) 100 MG tablet    Sig: Take 1 tablet (100 mg total) by mouth 2 (two) times daily.    Dispense:  60 tablet    Refill:  3     Discussed warning signs or symptoms. Please see discharge instructions. Patient expresses understanding.

## 2016-08-29 NOTE — Progress Notes (Signed)
Echo ordered printed and placed in referral folder.

## 2016-08-30 ENCOUNTER — Ambulatory Visit (HOSPITAL_BASED_OUTPATIENT_CLINIC_OR_DEPARTMENT_OTHER)
Admission: RE | Admit: 2016-08-30 | Discharge: 2016-08-30 | Disposition: A | Discharge status: Home / Self Care | Payer: BLUE CROSS/BLUE SHIELD | Source: Ambulatory Visit | Attending: Family Medicine | Admitting: Family Medicine

## 2016-08-30 DIAGNOSIS — Z6833 Body mass index (BMI) 33.0-33.9, adult: Secondary | ICD-10-CM | POA: Diagnosis not present

## 2016-08-30 DIAGNOSIS — R0789 Other chest pain: Secondary | ICD-10-CM | POA: Diagnosis present

## 2016-08-30 DIAGNOSIS — I10 Essential (primary) hypertension: Secondary | ICD-10-CM | POA: Diagnosis not present

## 2016-08-30 DIAGNOSIS — E669 Obesity, unspecified: Secondary | ICD-10-CM | POA: Insufficient documentation

## 2016-08-30 NOTE — Progress Notes (Signed)
  Echocardiogram 2D Echocardiogram has been performed. BP was 162/101 non symptomatic Nolon Rod 08/30/2016, 10:08 AM

## 2016-08-31 ENCOUNTER — Encounter: Payer: Self-pay | Admitting: Family Medicine

## 2016-08-31 DIAGNOSIS — I5189 Other ill-defined heart diseases: Secondary | ICD-10-CM | POA: Insufficient documentation

## 2016-09-04 NOTE — Progress Notes (Signed)
Referring-Corey, Michel Harrow, MD Reason for referral-Chest pain and PAF  HPI: FU PAF Echocardiogram April 2018 showed vigorous LV systolic function, mild left ventricular hypertrophy and grade 2 diastolic dysfunction. Mild left atrial enlargement. Cardiac catheterization April 2018 showed normal coronary arteries and normal LV systolic function. Left ventricular end-diastolic pressure elevated at 24. Chest x-ray April 2018 negative. TSH April 2018 1.61. Patient admitted April 2018 with chest pain and troponin minimally increased. He had a normal nuclear study but continued to have symptoms and therefore underwent cardiac catheterization as outlined above. He was noted to have intermittent atrial fibrillation and was anticoagulated and metoprolol/Cardizem added. Plan was to add flecanide if he had recurrent PAF. Note patient did have mild renal insufficiency and follow-up BMET was recommended at this office visit. LFTs also elevated (pt with h/o ETOH). Since discharge patient denies dyspnea on exertion, orthopnea, PND, pedal edema, bleeding or syncope. He had one brief episode of palpitations for 2-3 minutes not associated with chest pain.  Current Outpatient Prescriptions  Medication Sig Dispense Refill  . albuterol (PROVENTIL HFA;VENTOLIN HFA) 108 (90 Base) MCG/ACT inhaler Inhale 2 puffs into the lungs every 6 (six) hours as needed for wheezing or shortness of breath. 1 Inhaler 0  . allopurinol (ZYLOPRIM) 100 MG tablet Take 1 tablet (100 mg total) by mouth daily. 30 tablet 6  . atorvastatin (LIPITOR) 40 MG tablet Take 1 tablet (40 mg total) by mouth daily at 6 PM. NEED FOLLOW UP APPOINTMENT FOR MORE REFILLS 30 tablet 0  . budesonide-formoterol (SYMBICORT) 160-4.5 MCG/ACT inhaler Inhale 2 puffs into the lungs 2 (two) times daily. 1 Inhaler 0  . diltiazem (CARDIZEM CD) 180 MG 24 hr capsule Take 1 capsule (180 mg total) by mouth daily. 30 capsule 0  . metoprolol (LOPRESSOR) 100 MG tablet Take 1 tablet  (100 mg total) by mouth 2 (two) times daily. 60 tablet 3  . rivaroxaban (XARELTO) 20 MG TABS tablet Take 1 tablet (20 mg total) by mouth daily with supper. 30 tablet 0   No current facility-administered medications for this visit.     No Known Allergies   Past Medical History:  Diagnosis Date  . Asthma   . Atrial fibrillation (HCC)   . Gout   . Hypertension     Past Surgical History:  Procedure Laterality Date  . LEFT HEART CATH AND CORONARY ANGIOGRAPHY N/A 09/08/2016   Procedure: Left Heart Cath and Coronary Angiography;  Surgeon: Lyn Records, MD;  Location: [facility] INVASIVE CV LAB;  Service: Cardiovascular;  Laterality: N/A;  . THYROIDECTOMY  08/2015   Patient unsure how much was removed     Social History   Social History  . Marital status: Married    Spouse name: N/A  . Number of children: N/A  . Years of education: N/A   Occupational History  . Not on file.   Social History Main Topics  . Smoking status: Former Smoker    Packs/day: 0.25    Years: 3.00    Types: Cigarettes  . Smokeless tobacco: Current User    Types: Chew  . Alcohol use Yes     Comment: Occasionally per patient, 550cc per patient a week  . Drug use: No  . Sexual activity: Not on file   Other Topics Concern  . Not on file   Social History Narrative  . No narrative on file    Family History  Problem Relation Age of Onset  . Heart disease Mother   .  Gout Father     ROS: no fevers or chills, productive cough, hemoptysis, dysphasia, odynophagia, melena, hematochezia, dysuria, hematuria, rash, seizure activity, orthopnea, PND, pedal edema, claudication. Remaining systems are negative.  Physical Exam:   Blood pressure 125/75, pulse (!) 45, height  (1.6 m), weight 188 lb (85.3 kg).  General:  Well developed/well nourished in NAD Skin warm/dry Patient not depressed No peripheral clubbing Back-normal HEENT-normal/normal eyelids Neck supple/normal carotid upstroke bilaterally; no  bruits; no JVD; no thyromegaly chest - CTA/ normal expansion CV - Regular, bradycardic/normal S1 and S2; no murmurs, rubs or gallops;  PMI nondisplaced Abdomen -NT/ND, no HSM, no mass, + bowel sounds, no bruit 2+ femoral pulses, no bruits Ext-no edema, chords, 2+ DP Neuro-grossly nonfocal  ECG - 08/28/2016-sinus bradycardia, left ventricular hypertrophy. personally reviewed  Today's electrocardiogram shows marked sinus bradycardia at a rate of 45, left ventricular hypertrophy.  A/P  1 Paroxysmal atrial fibrillation-patient remains in sinus rhythm. I wonder if his previous alcohol use was contributing to his atrial fibrillation. He has discontinued this. If he has more frequent episodes in the future we will consider an antiarrhythmic such as flecainide. His heart rate is low today. Continue Cardizem. Decrease metoprolol to 75 mg twice a day and follow. Continue xarelto (CHADSvasc 2).   2 acute kidney disease-noted in the hospital. Plan repeat bmet today.  3 chest pain-no recurrent symptoms; patient had minimally elevated troponin in the hospital. His catheterization revealed no obstructive coronary disease. His troponin was likely increased from atrial fibrillation.  4 hypertension-blood pressure controlled. However he is mildly bradycardic. Decrease metoprolol to 75 mg twice a day and follow.  5 hyperlipidemia-continue statin. We will check liver functions in 4 weeks. I've asked him to avoid alcohol.  6 chronic diastolic congestive heart failure-euvolemic on examination.    Olga Millers, MD

## 2016-09-05 ENCOUNTER — Encounter (HOSPITAL_BASED_OUTPATIENT_CLINIC_OR_DEPARTMENT_OTHER): Payer: Self-pay | Admitting: Respiratory Therapy

## 2016-09-05 ENCOUNTER — Inpatient Hospital Stay (HOSPITAL_BASED_OUTPATIENT_CLINIC_OR_DEPARTMENT_OTHER)
Admission: EM | Admit: 2016-09-05 | Discharge: 2016-09-08 | DRG: 202 | Disposition: A | Discharge status: Home / Self Care | Payer: BLUE CROSS/BLUE SHIELD | Attending: Internal Medicine | Admitting: Internal Medicine

## 2016-09-05 DIAGNOSIS — J45901 Unspecified asthma with (acute) exacerbation: Secondary | ICD-10-CM

## 2016-09-05 DIAGNOSIS — I5189 Other ill-defined heart diseases: Secondary | ICD-10-CM | POA: Diagnosis present

## 2016-09-05 DIAGNOSIS — N179 Acute kidney failure, unspecified: Secondary | ICD-10-CM | POA: Diagnosis present

## 2016-09-05 DIAGNOSIS — I4891 Unspecified atrial fibrillation: Secondary | ICD-10-CM

## 2016-09-05 DIAGNOSIS — M1A9XX0 Chronic gout, unspecified, without tophus (tophi): Secondary | ICD-10-CM | POA: Diagnosis present

## 2016-09-05 DIAGNOSIS — Z79899 Other long term (current) drug therapy: Secondary | ICD-10-CM

## 2016-09-05 DIAGNOSIS — E119 Type 2 diabetes mellitus without complications: Secondary | ICD-10-CM | POA: Diagnosis present

## 2016-09-05 DIAGNOSIS — J45909 Unspecified asthma, uncomplicated: Secondary | ICD-10-CM | POA: Diagnosis present

## 2016-09-05 DIAGNOSIS — Z7982 Long term (current) use of aspirin: Secondary | ICD-10-CM

## 2016-09-05 DIAGNOSIS — I10 Essential (primary) hypertension: Secondary | ICD-10-CM | POA: Diagnosis not present

## 2016-09-05 DIAGNOSIS — I5032 Chronic diastolic (congestive) heart failure: Secondary | ICD-10-CM | POA: Diagnosis present

## 2016-09-05 DIAGNOSIS — I480 Paroxysmal atrial fibrillation: Secondary | ICD-10-CM | POA: Diagnosis present

## 2016-09-05 DIAGNOSIS — I110 Hypertensive heart disease with heart failure: Secondary | ICD-10-CM | POA: Diagnosis present

## 2016-09-05 DIAGNOSIS — R778 Other specified abnormalities of plasma proteins: Secondary | ICD-10-CM | POA: Diagnosis present

## 2016-09-05 DIAGNOSIS — M940 Chondrocostal junction syndrome [Tietze]: Secondary | ICD-10-CM | POA: Diagnosis present

## 2016-09-05 DIAGNOSIS — Z7901 Long term (current) use of anticoagulants: Secondary | ICD-10-CM

## 2016-09-05 DIAGNOSIS — E781 Pure hyperglyceridemia: Secondary | ICD-10-CM | POA: Diagnosis present

## 2016-09-05 DIAGNOSIS — E669 Obesity, unspecified: Secondary | ICD-10-CM | POA: Diagnosis present

## 2016-09-05 DIAGNOSIS — R7303 Prediabetes: Secondary | ICD-10-CM | POA: Diagnosis present

## 2016-09-05 DIAGNOSIS — R7989 Other specified abnormal findings of blood chemistry: Secondary | ICD-10-CM | POA: Diagnosis present

## 2016-09-05 DIAGNOSIS — F17200 Nicotine dependence, unspecified, uncomplicated: Secondary | ICD-10-CM | POA: Diagnosis present

## 2016-09-05 DIAGNOSIS — R079 Chest pain, unspecified: Secondary | ICD-10-CM

## 2016-09-05 DIAGNOSIS — E785 Hyperlipidemia, unspecified: Secondary | ICD-10-CM | POA: Diagnosis present

## 2016-09-05 HISTORY — DX: Unspecified atrial fibrillation: I48.91

## 2016-09-05 HISTORY — DX: Unspecified asthma, uncomplicated: J45.909

## 2016-09-05 NOTE — ED Triage Notes (Signed)
Patient states that he has had worsening SOB since yesterday  - he ran out of his inhaler. Patient has a strong non productive cough

## 2016-09-06 ENCOUNTER — Emergency Department (HOSPITAL_BASED_OUTPATIENT_CLINIC_OR_DEPARTMENT_OTHER): Payer: BLUE CROSS/BLUE SHIELD

## 2016-09-06 ENCOUNTER — Observation Stay (HOSPITAL_COMMUNITY): Payer: BLUE CROSS/BLUE SHIELD

## 2016-09-06 ENCOUNTER — Encounter (HOSPITAL_BASED_OUTPATIENT_CLINIC_OR_DEPARTMENT_OTHER): Payer: Self-pay | Admitting: Emergency Medicine

## 2016-09-06 ENCOUNTER — Other Ambulatory Visit (HOSPITAL_COMMUNITY): Payer: BLUE CROSS/BLUE SHIELD

## 2016-09-06 DIAGNOSIS — R7989 Other specified abnormal findings of blood chemistry: Secondary | ICD-10-CM

## 2016-09-06 DIAGNOSIS — I4891 Unspecified atrial fibrillation: Secondary | ICD-10-CM | POA: Diagnosis not present

## 2016-09-06 DIAGNOSIS — R778 Other specified abnormalities of plasma proteins: Secondary | ICD-10-CM | POA: Diagnosis present

## 2016-09-06 DIAGNOSIS — I519 Heart disease, unspecified: Secondary | ICD-10-CM | POA: Diagnosis not present

## 2016-09-06 DIAGNOSIS — R748 Abnormal levels of other serum enzymes: Secondary | ICD-10-CM | POA: Diagnosis not present

## 2016-09-06 DIAGNOSIS — I480 Paroxysmal atrial fibrillation: Secondary | ICD-10-CM | POA: Diagnosis not present

## 2016-09-06 DIAGNOSIS — R072 Precordial pain: Secondary | ICD-10-CM | POA: Diagnosis not present

## 2016-09-06 DIAGNOSIS — I10 Essential (primary) hypertension: Secondary | ICD-10-CM | POA: Diagnosis not present

## 2016-09-06 DIAGNOSIS — R079 Chest pain, unspecified: Secondary | ICD-10-CM | POA: Diagnosis not present

## 2016-09-06 DIAGNOSIS — R7303 Prediabetes: Secondary | ICD-10-CM | POA: Diagnosis not present

## 2016-09-06 DIAGNOSIS — J45909 Unspecified asthma, uncomplicated: Secondary | ICD-10-CM | POA: Diagnosis present

## 2016-09-06 DIAGNOSIS — E781 Pure hyperglyceridemia: Secondary | ICD-10-CM | POA: Diagnosis not present

## 2016-09-06 DIAGNOSIS — F17200 Nicotine dependence, unspecified, uncomplicated: Secondary | ICD-10-CM | POA: Diagnosis not present

## 2016-09-06 DIAGNOSIS — J4521 Mild intermittent asthma with (acute) exacerbation: Secondary | ICD-10-CM

## 2016-09-06 DIAGNOSIS — I214 Non-ST elevation (NSTEMI) myocardial infarction: Secondary | ICD-10-CM | POA: Diagnosis not present

## 2016-09-06 LAB — COMPREHENSIVE METABOLIC PANEL
ALT: 107 U/L — AB (ref 17–63)
ANION GAP: 11 (ref 5–15)
AST: 71 U/L — ABNORMAL HIGH (ref 15–41)
Albumin: 3.8 g/dL (ref 3.5–5.0)
Alkaline Phosphatase: 99 U/L (ref 38–126)
BUN: 16 mg/dL (ref 6–20)
CALCIUM: 10.4 mg/dL — AB (ref 8.9–10.3)
CHLORIDE: 102 mmol/L (ref 101–111)
CO2: 24 mmol/L (ref 22–32)
Creatinine, Ser: 1.02 mg/dL (ref 0.61–1.24)
GLUCOSE: 168 mg/dL — AB (ref 65–99)
Potassium: 4.1 mmol/L (ref 3.5–5.1)
SODIUM: 137 mmol/L (ref 135–145)
TOTAL PROTEIN: 7 g/dL (ref 6.5–8.1)
Total Bilirubin: 0.5 mg/dL (ref 0.3–1.2)

## 2016-09-06 LAB — NM MYOCAR MULTI W/SPECT W/WALL MOTION / EF
CHL CUP MPHR: 177 {beats}/min
CHL CUP RESTING HR STRESS: 61 {beats}/min
CSEPHR: 48 %
Estimated workload: 1 METS
Exercise duration (min): 0 min
Exercise duration (sec): 0 s
LHR: 0.32
LVDIAVOL: 116 mL (ref 62–150)
LVSYSVOL: 41 mL
Peak HR: 85 {beats}/min
RPE: 0
SDS: 0
SRS: 2
SSS: 2
TID: 1.4

## 2016-09-06 LAB — CBC
HCT: 46.9 % (ref 39.0–52.0)
Hemoglobin: 15.9 g/dL (ref 13.0–17.0)
MCH: 27.3 pg (ref 26.0–34.0)
MCHC: 33.9 g/dL (ref 30.0–36.0)
MCV: 80.4 fL (ref 78.0–100.0)
PLATELETS: 219 10*3/uL (ref 150–400)
RBC: 5.83 MIL/uL — AB (ref 4.22–5.81)
RDW: 13.5 % (ref 11.5–15.5)
WBC: 7.5 10*3/uL (ref 4.0–10.5)

## 2016-09-06 LAB — D-DIMER, QUANTITATIVE: D-Dimer, Quant: 0.37 ug/mL-FEU (ref 0.00–0.50)

## 2016-09-06 LAB — GLUCOSE, CAPILLARY
GLUCOSE-CAPILLARY: 282 mg/dL — AB (ref 65–99)
GLUCOSE-CAPILLARY: 281 mg/dL — AB (ref 65–99)

## 2016-09-06 LAB — MAGNESIUM: Magnesium: 1.6 mg/dL — ABNORMAL LOW (ref 1.7–2.4)

## 2016-09-06 LAB — HEMOGLOBIN A1C
Hgb A1c MFr Bld: 6.3 % — ABNORMAL HIGH (ref 4.8–5.6)
MEAN PLASMA GLUCOSE: 134 mg/dL

## 2016-09-06 LAB — TROPONIN I
Troponin I: 0.07 ng/mL (ref <0.03)
Troponin I: 0.32 ng/mL (ref <0.03)
Troponin I: <0.03 ng/mL (ref <0.03)
Troponin I: <0.03 ng/mL (ref <0.03)

## 2016-09-06 LAB — TSH: TSH: 1.61 u[IU]/mL (ref 0.350–4.500)

## 2016-09-06 LAB — BRAIN NATRIURETIC PEPTIDE: B NATRIURETIC PEPTIDE 5: 21.8 pg/mL (ref 0.0–100.0)

## 2016-09-06 MED ORDER — METOPROLOL TARTRATE 5 MG/5ML IV SOLN: 5.0000 mg | Freq: Once | INTRAVENOUS | Status: DC

## 2016-09-06 MED ORDER — TECHNETIUM TC 99M TETROFOSMIN IV KIT
10.0000 | PACK | Freq: Once | INTRAVENOUS | Status: AC | PRN
  Administered 2016-09-06: 10 via INTRAVENOUS

## 2016-09-06 MED ORDER — ACETAMINOPHEN 650 MG RE SUPP: 650.0000 mg | Freq: Four times a day (QID) | RECTAL | Status: DC | PRN

## 2016-09-06 MED ORDER — MORPHINE SULFATE (PF) 4 MG/ML IV SOLN
4.0000 mg | Freq: Once | INTRAVENOUS | Status: AC
  Administered 2016-09-06: 4 mg via INTRAVENOUS
  Filled 2016-09-06: qty 1

## 2016-09-06 MED ORDER — RIVAROXABAN 20 MG PO TABS
20.0000 mg | ORAL_TABLET | Freq: Every day | ORAL | Status: DC
  Administered 2016-09-06: 20 mg via ORAL
  Filled 2016-09-06: qty 1

## 2016-09-06 MED ORDER — PREDNISONE 20 MG PO TABS: 40.0000 mg | ORAL_TABLET | Freq: Every day | ORAL | Status: DC

## 2016-09-06 MED ORDER — ALBUTEROL (5 MG/ML) CONTINUOUS INHALATION SOLN
15.0000 mg/h | INHALATION_SOLUTION | Freq: Once | RESPIRATORY_TRACT | Status: AC
  Administered 2016-09-06: 15 mg/h via RESPIRATORY_TRACT
  Filled 2016-09-06: qty 20

## 2016-09-06 MED ORDER — INSULIN ASPART 100 UNIT/ML ~~LOC~~ SOLN
0.0000 [IU] | Freq: Every day | SUBCUTANEOUS | Status: DC
  Administered 2016-09-06: 3 [IU] via SUBCUTANEOUS

## 2016-09-06 MED ORDER — ENOXAPARIN SODIUM 40 MG/0.4ML ~~LOC~~ SOLN: 40.0000 mg | SUBCUTANEOUS | Status: DC

## 2016-09-06 MED ORDER — METHYLPREDNISOLONE SODIUM SUCC 125 MG IJ SOLR
125.0000 mg | Freq: Once | INTRAMUSCULAR | Status: AC
  Administered 2016-09-06: 125 mg via INTRAVENOUS
  Filled 2016-09-06: qty 2

## 2016-09-06 MED ORDER — ACETAMINOPHEN 325 MG PO TABS: 650.0000 mg | ORAL_TABLET | Freq: Four times a day (QID) | ORAL | Status: DC | PRN

## 2016-09-06 MED ORDER — LEVALBUTEROL HCL 0.63 MG/3ML IN NEBU: 0.6300 mg | INHALATION_SOLUTION | Freq: Four times a day (QID) | RESPIRATORY_TRACT | Status: DC | PRN

## 2016-09-06 MED ORDER — DILTIAZEM HCL ER COATED BEADS 120 MG PO CP24
120.0000 mg | ORAL_CAPSULE | Freq: Every day | ORAL | Status: DC
  Administered 2016-09-06: 120 mg via ORAL
  Filled 2016-09-06: qty 1

## 2016-09-06 MED ORDER — HYDRALAZINE HCL 20 MG/ML IJ SOLN
10.0000 mg | Freq: Once | INTRAMUSCULAR | Status: AC
  Administered 2016-09-06: 10 mg via INTRAVENOUS
  Filled 2016-09-06: qty 1

## 2016-09-06 MED ORDER — ALLOPURINOL 100 MG PO TABS
100.0000 mg | ORAL_TABLET | Freq: Every day | ORAL | Status: DC
  Administered 2016-09-06 – 2016-09-08 (×3): 100 mg via ORAL
  Filled 2016-09-06 (×3): qty 1

## 2016-09-06 MED ORDER — METOPROLOL TARTRATE 50 MG PO TABS
100.0000 mg | ORAL_TABLET | Freq: Once | ORAL | Status: AC
  Administered 2016-09-06: 100 mg via ORAL
  Filled 2016-09-06: qty 2

## 2016-09-06 MED ORDER — IPRATROPIUM BROMIDE 0.02 % IN SOLN
1.0000 mg | Freq: Once | RESPIRATORY_TRACT | Status: AC
  Administered 2016-09-06: 1 mg via RESPIRATORY_TRACT
  Filled 2016-09-06: qty 5

## 2016-09-06 MED ORDER — IPRATROPIUM-ALBUTEROL 0.5-2.5 (3) MG/3ML IN SOLN
3.0000 mL | Freq: Once | RESPIRATORY_TRACT | Status: AC
  Administered 2016-09-06: 3 mL via RESPIRATORY_TRACT
  Filled 2016-09-06: qty 3

## 2016-09-06 MED ORDER — DILTIAZEM HCL 30 MG PO TABS: 30.0000 mg | ORAL_TABLET | Freq: Three times a day (TID) | ORAL | Status: DC

## 2016-09-06 MED ORDER — ALBUTEROL SULFATE (2.5 MG/3ML) 0.083% IN NEBU: 2.5000 mg | INHALATION_SOLUTION | RESPIRATORY_TRACT | Status: DC | PRN

## 2016-09-06 MED ORDER — REGADENOSON 0.4 MG/5ML IV SOLN
0.4000 mg | Freq: Once | INTRAVENOUS | Status: AC
  Administered 2016-09-06: 0.4 mg via INTRAVENOUS

## 2016-09-06 MED ORDER — METHYLPREDNISOLONE SODIUM SUCC 125 MG IJ SOLR
60.0000 mg | Freq: Four times a day (QID) | INTRAMUSCULAR | Status: DC
  Administered 2016-09-06: 60 mg via INTRAVENOUS
  Filled 2016-09-06: qty 2

## 2016-09-06 MED ORDER — METHYLPREDNISOLONE SODIUM SUCC 125 MG IJ SOLR
60.0000 mg | Freq: Two times a day (BID) | INTRAMUSCULAR | Status: DC
  Administered 2016-09-07 (×2): 60 mg via INTRAVENOUS
  Filled 2016-09-06 (×2): qty 2

## 2016-09-06 MED ORDER — ATORVASTATIN CALCIUM 40 MG PO TABS
40.0000 mg | ORAL_TABLET | Freq: Every day | ORAL | Status: DC
  Administered 2016-09-06 – 2016-09-07 (×2): 40 mg via ORAL
  Filled 2016-09-06 (×2): qty 1

## 2016-09-06 MED ORDER — MAGNESIUM SULFATE 2 GM/50ML IV SOLN
2.0000 g | Freq: Once | INTRAVENOUS | Status: AC
  Administered 2016-09-06: 2 g via INTRAVENOUS
  Filled 2016-09-06: qty 50

## 2016-09-06 MED ORDER — METOPROLOL TARTRATE 100 MG PO TABS
100.0000 mg | ORAL_TABLET | Freq: Two times a day (BID) | ORAL | Status: DC
  Administered 2016-09-07 – 2016-09-08 (×3): 100 mg via ORAL
  Filled 2016-09-06 (×3): qty 1

## 2016-09-06 MED ORDER — REGADENOSON 0.4 MG/5ML IV SOLN
INTRAVENOUS | Status: AC
  Administered 2016-09-06: 0.4 mg via INTRAVENOUS
  Filled 2016-09-06: qty 5

## 2016-09-06 MED ORDER — SODIUM CHLORIDE 0.9% FLUSH
3.0000 mL | Freq: Two times a day (BID) | INTRAVENOUS | Status: DC
  Administered 2016-09-06 – 2016-09-07 (×3): 3 mL via INTRAVENOUS

## 2016-09-06 MED ORDER — INSULIN ASPART 100 UNIT/ML ~~LOC~~ SOLN
0.0000 [IU] | Freq: Three times a day (TID) | SUBCUTANEOUS | Status: DC
  Administered 2016-09-06: 5 [IU] via SUBCUTANEOUS
  Administered 2016-09-07: 3 [IU] via SUBCUTANEOUS

## 2016-09-06 MED ORDER — ALBUTEROL SULFATE (2.5 MG/3ML) 0.083% IN NEBU
2.5000 mg | INHALATION_SOLUTION | Freq: Once | RESPIRATORY_TRACT | Status: AC
  Administered 2016-09-06: 2.5 mg via RESPIRATORY_TRACT
  Filled 2016-09-06: qty 3

## 2016-09-06 MED ORDER — TECHNETIUM TC 99M TETROFOSMIN IV KIT
30.0000 | PACK | Freq: Once | INTRAVENOUS | Status: AC | PRN
  Administered 2016-09-06: 30 via INTRAVENOUS

## 2016-09-06 NOTE — Progress Notes (Signed)
lexiscan myoview stress portion completed without complication.  nuc portion pending.

## 2016-09-06 NOTE — ED Notes (Signed)
Patient with increased HR and chest pain - HR elevated and sustained. The patient stays with a HR of 167 x 2 -3 minutes. EKG done, given to MD - patient returns to HR in 80's  - MD aware

## 2016-09-06 NOTE — Progress Notes (Signed)
Cardiology texted paged results of troponin.  Trend is <0.03, <0.03, 0.07, 0.32

## 2016-09-06 NOTE — Progress Notes (Signed)
ANTICOAGULATION CONSULT NOTE - Initial Consult  Pharmacy Consult for Rivaroxaban Indication: atrial fibrillation  No Known Allergies  Patient Measurements: Height:  (160 cm) Weight: 189 lb 8 oz (86 kg) IBW/kg (Calculated) : 56.9  Vital Signs: Temp: 97.4 F (36.3 C) (04/18 0558) Temp Source: Oral (04/18 0558) BP: 147/81 (04/18 0616) Pulse Rate: 111 (04/18 0558)  Labs:  Recent Labs  09/06/16 0008 09/06/16 0010 09/06/16 0429  HGB 15.9  --   --   HCT 46.9  --   --   PLT 219  --   --   CREATININE 1.02  --   --   TROPONINI  --  <0.03 <0.03    Estimated Creatinine Clearance: 90.5 mL/min (by C-G formula based on SCr of 1.02 mg/dL).   Medical History: Past Medical History:  Diagnosis Date  . Asthma   . Atrial fibrillation (HCC)   . Gout   . Hypertension     Medications:  Scheduled:  . allopurinol  100 mg Oral Daily  . atorvastatin  40 mg Oral q1800  . diltiazem  30 mg Oral Q8H  . insulin aspart  0-5 Units Subcutaneous QHS  . insulin aspart  0-9 Units Subcutaneous TID WC  . methylPREDNISolone (SOLU-MEDROL) injection  60 mg Intravenous Q6H  . metoprolol  5 mg Intravenous Once  . [START ON 09/07/2016] metoprolol  100 mg Oral BID  . sodium chloride flush  3 mL Intravenous Q12H    Assessment: 46yo male with PAF and CHADS-Vasc score of 2 who presented with asthma exacerbation.  He has not previously received anticoagulation and will be starting Rivaroxaban.  Goal of Therapy:  prevention of stroke Monitor platelets by anticoagulation protocol: Yes   Plan:  Rivaroxaban  po qday Watch for s/s of bleeding Education completed and encouraged him to ask for me if question arise   Marisue Humble, PharmD Clinical Pharmacist [facility]

## 2016-09-06 NOTE — Consult Note (Signed)
Cardiology Consult    Patient ID: Billy Beard MRN: [id number], DOB/AGE: Jan 18, 1971   Admit date: 09/05/2016 Date of Consult: 09/06/2016  Primary Physician: Clementeen Graham, MD Primary Cardiologist: Dr. Katrinka Blazing Requesting Provider: Dr. Julian Reil  Reason for Consult: chest pain  Patient Profile    Billy Beard is a 46 yo male with a PMH significant for paroxysmal atrial fibrillation, grade 2 diastolic dysfunction, asthma, HTN, and gout. He reported to [facility] with SOB and chest pain.  Billy Beard is a 46 y.o. male who is being seen today for the evaluation of chest pain at the request of Dr. Julian Reil.    Past Medical History   Past Medical History:  Diagnosis Date  . Asthma   . Atrial fibrillation (HCC)   . Gout   . Hypertension     History reviewed. No pertinent surgical history.   Allergies  No Known Allergies  History of Present Illness    He was previously seen at [facility] on 08/28/16 for coughing with associated chest pain that was felt to be costochondritis. Echo at that time with grade 2 diastolic dysfunction. He was discharged with inhalers.   He presented to [facility] on 09/05/16 with shortness of breath and cough, stating he ran out of his inhaler.  He was treated for asthma exacerbation. He was given an hour long duo-neb treatment for active wheezing and developed a bout of atrial fibrillation, which was not captured on EKG at that time. He was transferred to [facility] for observation. Telemetry reveals rate controlled Afib. EKG confirms Afib RVR. He is currently on his home lopressor of 100 mg BID. Primary team added diltiazem to achieve rate control. He is not anticoagulated at this time.  He was previously seen by [place] in consult on 09/10/15.  At that time, he had burning chest pain and shortness of breath. He was found to have paroxysmal Afib. A myoview at that time was low risk.  He was not anticoagulated at that time given his This patients CHA2DS2-VASc Score of only 1  (HTN).   On my interview, he states he has had chest pain since his April 2017 hospitalization. The chest pain is worse when he stretches and is reproducible on exam. He also states he has chest pain when he feels palpitations, likely bout of Afib. The chest pain and palpitations resolve with rest. He states his shortness of breath is much worse and he has not been able to sleep supine for the past week. He also states he travels by plane for work and recently returned from a [employer].   Inpatient Medications    . allopurinol  100 mg Oral Daily  . atorvastatin  40 mg Oral q1800  . enoxaparin (LOVENOX) injection  40 mg Subcutaneous Q24H  . methylPREDNISolone (SOLU-MEDROL) injection  60 mg Intravenous Q6H  . [START ON 09/07/2016] metoprolol  100 mg Oral BID  . sodium chloride flush  3 mL Intravenous Q12H     Outpatient Medications    Prior to Admission medications   Medication Sig Start Date End Date Taking? Authorizing Provider  allopurinol (ZYLOPRIM) 100 MG tablet Take 1 tablet (100 mg total) by mouth daily. 08/28/16   Rodolph Bong, MD  atorvastatin (LIPITOR) 40 MG tablet Take 1 tablet (40 mg total) by mouth daily at 6 PM. NEED FOLLOW UP APPOINTMENT FOR MORE REFILLS 06/21/16   Rodolph Bong, MD  metoprolol (LOPRESSOR) 100 MG tablet Take 1 tablet (100 mg total) by mouth 2 (  two) times daily. 08/28/16   Rodolph Bong, MD  triamcinolone cream (KENALOG) 0.5 % Apply 1 application topically 2 (two) times daily. To affected areas. 02/01/16   Rodolph Bong, MD     Family History    Family History  Problem Relation Age of Onset  . Heart disease Mother   . Gout Father     Social History    Social History   Social History  . Marital status: Married    Spouse name: N/A  . Number of children: N/A  . Years of education: N/A   Occupational History  . Not on file.   Social History Main Topics  . Smoking status: Current Every Day Smoker    Packs/day: 0.25    Types: Cigarettes  .  Smokeless tobacco: Never Used  . Alcohol use No  . Drug use: No  . Sexual activity: Not on file   Other Topics Concern  . Not on file   Social History Narrative  . No narrative on file     Review of Systems    General:  No chills, fever, night sweats or weight changes.  Cardiovascular:  Positive for chest pain, dyspnea on exertion, and edema, orthopnea, palpitations, and paroxysmal nocturnal dyspnea. Dermatological: No rash, lesions/masses Respiratory: Positive for cough and dyspnea Urologic: No hematuria, dysuria Abdominal:   No nausea, vomiting, diarrhea, bright red blood per rectum, melena, or hematemesis Neurologic:  No visual changes, changes in mental status. All other systems reviewed and are otherwise negative except as noted above.  Physical Exam    Blood pressure (!) 147/81, pulse (!) 111, temperature 97.4 F (36.3 C), temperature source Oral, resp. rate (!) 21, height  (1.6 m), weight 189 lb 8 oz (86 kg), SpO2 95 %.  General: Pleasant, NAD Psych: Normal affect. Neuro: Alert and oriented X 3. Moves all extremities spontaneously. HEENT: Normal  Neck: Supple without bruits, no JVD. Lungs:  Resp regular and unlabored, CTA. Heart: RRR no s3, s4, or murmurs. Abdomen: Soft, non-tender, non-distended, BS + x 4.  Extremities: No clubbing, cyanosis, trace to 1+ edema R>L. DP/PT/Radials 2+ and equal bilaterally.  Labs    Troponin (Point of Care Test) No results for input(s): TROPIPOC in the last 72 hours.  Recent Labs  09/06/16 0010 09/06/16 0429  TROPONINI <0.03 <0.03   Lab Results  Component Value Date   WBC 7.5 09/06/2016   HGB 15.9 09/06/2016   HCT 46.9 09/06/2016   MCV 80.4 09/06/2016   PLT 219 09/06/2016    Recent Labs Lab 09/06/16 0008  NA 137  K 4.1  CL 102  CO2 24  BUN 16  CREATININE 1.02  CALCIUM 10.4*  PROT 7.0  BILITOT 0.5  ALKPHOS 99  ALT 107*  AST 71*  GLUCOSE 168*   Lab Results  Component Value Date   CHOL 206 (H)  09/09/2015   HDL 43 09/09/2015   LDLCALC 140 (H) 09/09/2015   TRIG 116 09/09/2015   Lab Results  Component Value Date   DDIMER <0.27 09/09/2015     Radiology Studies    Dg Chest 2 View  Result Date: 08/29/2016 CLINICAL DATA:  Left side chest pain EXAM: CHEST  2 VIEW COMPARISON:  09/08/2015 FINDINGS: Heart and mediastinal contours are within normal limits. No focal opacities or effusions. No acute bony abnormality. IMPRESSION: No active cardiopulmonary disease. Electronically Signed   By: Charlett Nose M.D.   On: 08/29/2016 07:52   Dg Chest Portable 1 View  Result Date: 09/06/2016 CLINICAL DATA:  Worsening shortness of breath since yesterday. Ran out of inhaler. Nonproductive cough. EXAM: PORTABLE CHEST 1 VIEW COMPARISON:  08/28/2016 FINDINGS: Low lung volumes. Normal heart size and pulmonary vascularity. No focal airspace disease or consolidation in the lungs. No blunting of costophrenic angles. No pneumothorax. Mediastinal contours appear intact. Degenerative changes in the spine and shoulders. IMPRESSION: No active disease. Electronically Signed   By: Burman Nieves M.D.   On: 09/06/2016 01:17    ECG & Cardiac Imaging    EKG 09/06/16: Afib ventricular rate 167; ST depression in I, II, AVF, V5/V6  EKG  09/06/16: sinus rhythm  Echocardiogram 08/30/16: Study Conclusions - Left ventricle: The cavity size was normal. There was mild   concentric hypertrophy. Systolic function was vigorous. The   estimated ejection fraction was in the range of 65% to 70%. Wall   motion was normal; there were no regional wall motion   abnormalities. Features are consistent with a pseudonormal left   ventricular filling pattern, with concomitant abnormal relaxation   and increased filling pressure (grade 2 diastolic dysfunction). - Left atrium: The atrium was mildly dilated. Volume/bsa, ES   (1-plane Simpson&'s, A4C): 36.6 ml/m^2. - Pericardium, extracardiac: There was no pericardial  effusion.  Impressions: - Compared to the prior study, there has been no significant   interval change.   Myoview 09/10/15:  There was no ST segment deviation noted during stress.  The study is normal.  This is a low risk study. No ischemia.   Echocardiogram 09/10/15: Study Conclusions - Left ventricle: The cavity size was normal. Wall thickness was   normal. Systolic function was vigorous. The estimated ejection   fraction was in the range of 65% to 70%. Wall motion was normal;   there were no regional wall motion abnormalities. Left   ventricular diastolic function parameters were normal. - Aortic valve: There was no stenosis. - Mitral valve: There was trivial regurgitation. - Left atrium: The atrium was moderately dilated. - Right ventricle: The cavity size was normal. Systolic function   was normal. - Right atrium: The atrium was mildly dilated. - Pulmonary arteries: PA peak pressure: 25 mm Hg (S). - Systemic veins: IVC measured 2.3 cm with > 50% respirophasic   variation, suggesting RA pressure 8 mmHg.  Impressions: - Normal LV size with mild LV hypertrophy. EF 65-70%. Normal   diastolic function. Normal RV size and systolic function. No   significant valvular abnormalities.  Assessment & Plan    1. Chest pain - troponin x 2 negative - EKG with Afib RVR with new ST depressions not evident on EKG with sinus rhythm - D dimer pending   2. Atrial fibrillation with RVR - EKG with Afib in the 160s - on home lopressor 100 mg BID and new cardizem 30 mg q8hr - start heparin drip, need to discuss with patient coumadin vs NOAC - TSH and Mg pending (ordered)   3. Chronic diastolic heart failure - recent echo with grad 2 DD, which is new compared to echo on 09/10/15 - BNP on admission 21.8    4. HLD - continue lipitor - repeat lipid panel pending   5. HTN - has been controlled on home med of lopressor and new cardizem - has been hypertensive this  hospitalization that may correspond with Afib - recommend starting ARB for pressure control   Billy Beard symptoms, shortness of breath, leg swelling, chest pain, and palpitations, may be multifactorial. Chest pain may be a combination  of costochondritis from coughing (reproducible on exam) and paroxysmal atrial fibrillation with RVR. Chest pain could also originate from PE. He has lower extremity swelling that is greater on his right compared to left. Recent echocardiogram revealed new grade 2 diastolic dysfunction, which could also be contributing to his shortness of breath and unable to sleep supine. Troponin x 2 have been negative; however, EKG concerning for ischemia.  Given his new diastolic heart failure, will recommend further ischemic workup. Will discuss with attending. Repeat EKG in sinus rhythm.  If D-dimer is elevated, would proceed with lower extremity duplex and CTA. Would have low threshold for evaluating PE in the setting of recent travel.  Given his new CHA2DS2-VASc Score and unadjusted Ischemic Stroke Rate (% per year): is equal to 2.2 % stroke rate/year from a score of 2 (HTN, CHF, borderline DM), would recommend anticoagulation: heparin drip for now. Will discuss with patient coumadin vs NOAC.    Signed, Roe Rutherford Duke, PA-C 09/06/2016, 7:54 AM [phone]   As above, patient seen and examined. Briefly he is a 46 year old male with past medical history of hypertension, asthma for evaluation of chest pain and atrial fibrillation at the request of Dr. Julian Reil. Patient is a difficult historian. He states he has had intermittent chest pain for approximately one year. It occurs every 2 days to 1 week. It is substernal without radiation. He states there is associated nausea, diaphoresis and dyspnea. The pain can increase with placing his arms over his head and also with inspiration. He developed recurrent pain over the past 3 days that has been continuous. He also notes dyspnea on  exertion but no orthopnea or PND. He also complains of intermittent palpitations. He was noted to be in atrial fibrillation with rapid ventricular response last evening and admitted. Cardiology asked to evaluate. Laboratories showed glucose 168, SGOT 71, SGPT 107, BNP 21, negative enzymes, hemoglobin 15.9. Echocardiogram shows normal LV function, mild LVH, grade 2 diastolic dysfunction, mild left atrial enlargement. Chest x-ray negative. Electrocardiogram showed atrial fibrillation versus atrial flutter at a rate of 167. Inferior lateral ST depression. He has converted to sinus rhythm on telemetry.  1 chest pain-symptoms are atypical but he did have ST depression with tachycardia. I will arrange a stress nuclear study for risk stratification.   2 Paroxysmal atrial fibrillation versus flutter-patient is now back in sinus rhythm. Echocardiogram shows normal LV function. Check TSH. Would continue metoprolol 100 mg twice a day and change Cardizem to 120 mg daily for rate control if atrial fibrillation recurs. I will review strips with electrophysiology to see if this rhythm is ablatable. CHADSvasc 2 for HTN and DM. Would DC asa and add xarelto at DC.   3 hypertension-blood pressure elevated. Continue metoprolol and add Cardizem as described above. Follow blood pressure and adjust regimen as needed.  4 elevated liver functions-likely secondary to alcohol use possibly in combination with statin. We discussed the importance of decreasing ETOH use.   5 hyperlipidemia-continue statin.   If nuclear study shows no ischemia patient can be discharged and follow-up with me in [facility].   Olga Millers, MD

## 2016-09-06 NOTE — ED Notes (Signed)
Attempted to call report to [facility]W-nurse not available for report and will call back.

## 2016-09-06 NOTE — Progress Notes (Addendum)
Troponin mildly elevated most likely due to tachycardia and nuc is neg for ischemia.  I notified pt by phone, he continues to have SOB.

## 2016-09-06 NOTE — H&P (Signed)
History and Physical    Billy Beard BWG:[id number] DOB: 01-09-1971 DOA: 09/05/2016   PCP: Lynne Leader, MD   Patient coming from/Resides with: Private residence/lives with family  Admission status: Observation/telemetry -it may be medically necessary to stay a minimum 2 midnights to rule out impending and/or unexpected changes in physiologic status that may differ from initial evaluation performed in the ER and/or at time of admission therefore consider reevaluation of admission status 24 hours.   Chief Complaint: Shortness of breath with chest tightness  HPI: Billy Beard is a 46 y.o. [place] male with medical history significant for obesity, dyslipidemia with hypertriglyceridemia, chronic elevation in LFTs, hypertension, PAF prediabetes, grade 2 chronic diastolic dysfunction and asthma. Patient has been having issues with increasing palpitations over the past several weeks and was evaluated by his cardiologist in [facility] this past week and has undergone an echocardiogram. He said yesterday that he developed shortness of breath with chest tightness associated with taking in a deep breath and then later developed palpitations. While he is having palpitations he feels generalized weakness and very anxious. He also reports that he recently flew in from [place] and was on the plane for 5 hours. He reports similar symptoms as described above after a long plane ride last year. In the ER patient was documented. In the ER while receiving an albuterol nebulizer treatment patient had paroxysmal atrial fibrillation. After arrival to this facility from [facility] he also developed an episode of paroxysmal nature fibrillation with ventricular rates in the 130s to 150s and spontaneously reverted back to sinus rhythm.  ED Course:  Vital Signs: BP (!) 147/81   Pulse (!) 111   Temp 97.4 F (36.3 C) (Oral)   Resp (!) 21   Ht 5' 3"  (1.6 m)   Wt 86 kg (189 lb 8 oz)   SpO2 95%   BMI 33.57 kg/m  PCXR: Negative for  infiltrate or edema Lab data: Sodium 137, potassium 4.1, glucose 168, anion gap 11, BUN 16, creatinine 1.02, calcium 10.4, AST 71, ALT 107, BNP 22, troponin less than 0.032 collections, white count 7500 range and not obtained, hemoglobin 15.9, platelets 219,000 Medications and treatments: Proventil neb 2.5 mg 1, DuoNeb 1, albuterol continuous neb 15 mg/hr1, Solu-Medrol IV 125 mg 1, Atrovent neb 1 mg 1 hydralazine 10 mg IV 2, morphine 4 mg IV 1, Lopressor 100 mg by mouth 1   Review of Systems:  In addition to the HPI above,  No Fever-chills, myalgias or other constitutional symptoms No Headache, changes with Vision or hearing, new weakness, tingling, numbness in any extremity, dizziness, dysarthria or word finding difficulty, gait disturbance or imbalance, tremors or seizure activity No problems swallowing food or Liquids, indigestion/reflux, choking or coughing while eating, abdominal pain with or after eating No Cough, orthopnea or DOE No Abdominal pain, N/V, melena,hematochezia, dark tarry stools, constipation No dysuria, malodorous urine, hematuria or flank pain No new skin rashes, lesions, masses or bruises, No new joint pains, aches, swelling or redness No recent unintentional weight gain or loss No polyuria, polydypsia or polyphagia   Past Medical History:  Diagnosis Date  . Asthma   . Atrial fibrillation ([place])   . Gout   . Hypertension     History reviewed. No pertinent surgical history.  Social History   Social History  . Marital status: Married    Spouse name: N/A  . Number of children: N/A  . Years of education: N/A   Occupational History  . Not on file.  Social History Main Topics  . Smoking status: Current Every Day Smoker    Packs/day: 0.25    Types: Cigarettes  . Smokeless tobacco: Never Used  . Alcohol use No  . Drug use: No  . Sexual activity: Not on file   Other Topics Concern  . Not on file   Social History Narrative  . No narrative on  file    Mobility: Independent  Work history: Works in an office   No Known Allergies  Family History  Problem Relation Age of Onset  . Heart disease Mother   . Gout Father      Prior to Admission medications   Medication Sig Start Date End Date Taking? Authorizing Provider  allopurinol (ZYLOPRIM) 100 MG tablet Take 1 tablet (100 mg total) by mouth daily. 08/28/16   Gregor Hams, MD  atorvastatin (LIPITOR) 40 MG tablet Take 1 tablet (40 mg total) by mouth daily at 6 PM. NEED FOLLOW UP APPOINTMENT FOR MORE REFILLS 06/21/16   Gregor Hams, MD  metoprolol (LOPRESSOR) 100 MG tablet Take 1 tablet (100 mg total) by mouth 2 (two) times daily. 08/28/16   Gregor Hams, MD  triamcinolone cream (KENALOG) 0.5 % Apply 1 application topically 2 (two) times daily. To affected areas. 02/01/16   Gregor Hams, MD    Physical Exam: Vitals:   09/06/16 0445 09/06/16 0450 09/06/16 0558 09/06/16 0616  BP: (!) 188/110 (!) 184/111 (!) 180/105 (!) 147/81  Pulse: (!) 107 (!) 109 (!) 111   Resp: (!) 24 20 (!) 21 (!) 21  Temp:   97.4 F (36.3 C)   TempSrc:   Oral   SpO2: 100% 99% 95%   Weight:   86 kg (189 lb 8 oz)   Height:   5' 3"  (1.6 m)       Constitutional: NAD, calm, comfortable Eyes: PERRL, lids and conjunctivae normal ENMT: Mucous membranes are moist. Posterior pharynx clear of any exudate or lesions.Normal dentition.  Neck: normal, supple, no masses, no thyromegaly Respiratory: clear to auscultation bilaterally, no wheezing, no crackles. Normal respiratory effort. No accessory muscle use.  Cardiovascular: Regular rate and rhythm, no rubs / gallops. Grade 2/6 systolic murmur left sternal border, No extremity edema. 2+ pedal pulses. No carotid bruits.  Abdomen: no tenderness, no masses palpated. No hepatosplenomegaly. Bowel sounds positive.  Musculoskeletal: no clubbing / cyanosis. No joint deformity upper and lower extremities. Good ROM, no contractures. Normal muscle tone.  Skin: no rashes,  lesions, ulcers. No induration Neurologic: CN 2-12 grossly intact. Sensation intact, DTR normal. Strength 5/5 x all 4 extremities.  Psychiatric: Normal judgment and insight. Alert and oriented x 3. Normal mood.    Labs on Admission: I have personally reviewed following labs and imaging studies  CBC:  Recent Labs Lab 09/06/16 0008  WBC 7.5  HGB 15.9  HCT 46.9  MCV 80.4  PLT 263   Basic Metabolic Panel:  Recent Labs Lab 09/06/16 0008  NA 137  K 4.1  CL 102  CO2 24  GLUCOSE 168*  BUN 16  CREATININE 1.02  CALCIUM 10.4*   GFR: Estimated Creatinine Clearance: 90.5 mL/min (by C-G formula based on SCr of 1.02 mg/dL). Liver Function Tests:  Recent Labs Lab 09/06/16 0008  AST 71*  ALT 107*  ALKPHOS 99  BILITOT 0.5  PROT 7.0  ALBUMIN 3.8   No results for input(s): LIPASE, AMYLASE in the last 168 hours. No results for input(s): AMMONIA in the last 168 hours. Coagulation  Profile: No results for input(s): INR, PROTIME in the last 168 hours. Cardiac Enzymes:  Recent Labs Lab 09/06/16 0010 09/06/16 0429  TROPONINI <0.03 <0.03   BNP (last 3 results) No results for input(s): PROBNP in the last 8760 hours. HbA1C: No results for input(s): HGBA1C in the last 72 hours. CBG: No results for input(s): GLUCAP in the last 168 hours. Lipid Profile: No results for input(s): CHOL, HDL, LDLCALC, TRIG, CHOLHDL, LDLDIRECT in the last 72 hours. Thyroid Function Tests: No results for input(s): TSH, T4TOTAL, FREET4, T3FREE, THYROIDAB in the last 72 hours. Anemia Panel: No results for input(s): VITAMINB12, FOLATE, FERRITIN, TIBC, IRON, RETICCTPCT in the last 72 hours. Urine analysis: No results found for: COLORURINE, APPEARANCEUR, LABSPEC, PHURINE, GLUCOSEU, HGBUR, BILIRUBINUR, KETONESUR, PROTEINUR, UROBILINOGEN, NITRITE, LEUKOCYTESUR Sepsis Labs: @LABRCNTIP (procalcitonin:4,lacticidven:4) )No results found for this or any previous visit (from the past 240 hour(s)).    Radiological Exams on Admission: Dg Chest Portable 1 View  Result Date: 09/06/2016 CLINICAL DATA:  Worsening shortness of breath since yesterday. Ran out of inhaler. Nonproductive cough. EXAM: PORTABLE CHEST 1 VIEW COMPARISON:  08/28/2016 FINDINGS: Low lung volumes. Normal heart size and pulmonary vascularity. No focal airspace disease or consolidation in the lungs. No blunting of costophrenic angles. No pneumothorax. Mediastinal contours appear intact. Degenerative changes in the spine and shoulders. IMPRESSION: No active disease. Electronically Signed   By: Lucienne Capers M.D.   On: 09/06/2016 01:17    EKG: (Independently reviewed) initial EKG unremarkable except for voltage criteria are met for LVH-underlying rhythm sinus without tachycardia. Repeat EKG with tachycardia after neb treatments with a ventricular rate of 113 bpm, QTC 500 ms, diffuse ST segment depression when compared to baseline EKG although depression not lower than Q wave voltage.   Assessment/Plan Principal Problem:   Asthma exacerbation -Presents with shortness of breath and chest tightness without coughing followed by palpitations, chest tightness, generalized weakness and anxiety -Treat as asthma exacerbation-currently no wheezes on exam and moving air well -Respiratory symptoms actually may be more reflective of episodic PAF w/ RVR (see below) -For now continue Solu-Medrol 60 mg IV every 6 hours -Utilize Xopenex neb in setting of paroxysmal tachycardia as described -Patient also reports recent air travel 5 hours duration and similar symptoms after air travel last year-for completeness of exam check d-dimer and if elevated check CT of the chest  Active Problems:   Smoker -Patient admits to oral tobacco use denies actual smoking -Counseled regarding cessation    Paroxysmal atrial fibrillation  -Has had recurrent episodes here with tachycardia arrhythmias despite utilization of high-dose beta blocker at home-was  also given dosage earlier this morning after episodic RVR -Add Cardizem-we'll use short acting formulation 30 mg q 8 hrs initially and titrated up as tolerated -Agree w/ TSH -Has recently been evaluated by his OP cardiologist in [facility]) and just underwent echocardiogram so no need to repeat; echocardiogram here last year did reveal moderate atrial dilatation -Had low risk Myoview at this facility last April-given persistent chest pain cardiology plans repeating stress test this admission -CHADVASc = 2 (HTN and prediabetes w/ HgbA1c 6.1 in 2017) -Xarelto-pharmacy to dose-cardiology recommended utilizing IV heparin now and thickening Xarelto at discharge -Cardiology consulted    HTN (hypertension) -Not well controlled -Continue home dosage Lopressor -Adding Cardizem as above    Transaminitis -Nonobstructive pattern and no significant dyslipidemia with hypertriglyceridemia -Suspect related to fatty liver disease -Abdominal ultrasound can be completed in the outpatient setting by PCP -Of note, patient denied alcohol use  to me but admitted ongoing alcohol use to the cardiologist who suspects persistent elevation of liver enzymes secondary to ongoing alcohol use concomitant with use of statin    Obesity -Weight reduction strategies per PCP    Hypertriglyceridemia -Initiated on medical therapy last year -Initial TG was 623 with TC 202 and HDL 29 -1 month later cholesterol was still elevated but HDL was 43 LDL 140 and TG 116 -FLP in a.m. -Med adjustments per outpatient PCP and/or cardiologist    Prediabetes -Hemoglobin A1c has been 6.1 for the past 2 years -Repeat this admission -Follow CBGs and provide SS I indicated    Grade 2 chronic Diastolic dysfunction -Based on echocardiogram from April 2017 -No prior history of CHF exacerbation and not on diuretics -No evidence of pulmonary hypertension at this juncture and only mild LV hypertrophy -Was noted to have trivial  regurgitation of the mitral valve but does have a grade 2/6 systolic murmur -Patient reports recent echocardiogram and outpatient cardiology in [place]-may need to obtain these results      DVT prophylaxis:  Xarelto  Code Status:  full  Family Communication:  no family at bedside  Disposition Plan: Home  Consults called: Cardiology/[facility] on-call cardiologist    Samella Parr ANP-BC [facility] Pager [phone]   If 7PM-7AM, please contact night-coverage [email or web address] Password [facility]  09/06/2016, 8:13 AM

## 2016-09-06 NOTE — Plan of Care (Signed)
45 yo M accepted in transfer from [facility].  Patient with h/o Asthma, grade 2 diastolic dysfunction.  Patient presented to [facility] with wheezing and SOB today.  Asthma exacerbation that has improved somewhat with hour long neb treatment and solumedrol.  Patient did have a very short run of A.Fib after being given the hour long neb treatment.  This was so short that they couldn't get an EKG of it before he converted out of A.Fib.  Patient has been transferred for observation.

## 2016-09-06 NOTE — ED Provider Notes (Signed)
TIME SEEN: 12:15 AM  CHIEF COMPLAINT: Shortness of breath, cough  HPI: Patient is a 46 year old male with history of hypertension, asthma, grade 2 diastolic dysfunction who presents the emergency department with shortness of breath and cough that started last night. He denies any chest pain or chest discomfort. No fevers. No leg swelling or pain. Has had similar symptoms approximately 8 year ago when he was admitted for an asthma exacerbation. Recently had an echocardiogram that showed normal ejection fraction with grade 2 diastolic dysfunction. No history of PE or DVT.  ROS: See HPI Constitutional: no fever  Eyes: no drainage  ENT: no runny nose   Cardiovascular:  no chest pain  Resp:  SOB  GI: no vomiting GU: no dysuria Integumentary: no rash  Allergy: no hives  Musculoskeletal: no leg swelling  Neurological: no slurred speech ROS otherwise negative  PAST MEDICAL HISTORY/PAST SURGICAL HISTORY:  Past Medical History:  Diagnosis Date  . Gout   . Hypertension     MEDICATIONS:  Prior to Admission medications   Medication Sig Start Date End Date Taking? Authorizing Provider  allopurinol (ZYLOPRIM) 100 MG tablet Take 1 tablet (100 mg total) by mouth daily. 08/28/16   Rodolph Bong, MD  atorvastatin (LIPITOR) 40 MG tablet Take 1 tablet (40 mg total) by mouth daily at 6 PM. NEED FOLLOW UP APPOINTMENT FOR MORE REFILLS 06/21/16   Rodolph Bong, MD  metoprolol (LOPRESSOR) 100 MG tablet Take 1 tablet (100 mg total) by mouth 2 (two) times daily. 08/28/16   Rodolph Bong, MD  triamcinolone cream (KENALOG) 0.5 % Apply 1 application topically 2 (two) times daily. To affected areas. 02/01/16   Rodolph Bong, MD    ALLERGIES:  No Known Allergies  SOCIAL HISTORY:  Social History  Substance Use Topics  . Smoking status: Current Every Day Smoker    Packs/day: 0.25    Types: Cigarettes  . Smokeless tobacco: Never Used  . Alcohol use No    FAMILY HISTORY: Family History  Problem Relation Age of  Onset  . Heart disease Mother   . Gout Father     EXAM: BP (!) 186/94 (BP Location: Right Arm)   Pulse 69   Temp 98.6 F (37 C) (Oral)   Resp (!) 26   Wt 192 lb (87.1 kg)   SpO2 92%   BMI 34.01 kg/m  CONSTITUTIONAL: Alert and oriented and responds appropriately to questions. Well-appearing; well-nourished HEAD: Normocephalic EYES: Conjunctivae clear, pupils appear equal, EOMI ENT: normal nose; moist mucous membranes NECK: Supple, no meningismus, no nuchal rigidity, no LAD  CARD: RRR; S1 and S2 appreciated; no murmurs, no clicks, no rubs, no gallops RESP: Normal chest excursion without splinting, Patient is tachypneic, sats 90% on room air at rest, diffuse rhonchorous breath sounds, diminished at bases, diffuse expiratory wheezing appreciated, speaking full sentences ABD/GI: Normal bowel sounds; non-distended; soft, non-tender, no rebound, no guarding, no peritoneal signs, no hepatosplenomegaly BACK:  The back appears normal and is non-tender to palpation, there is no CVA tenderness EXT: Normal ROM in all joints; non-tender to palpation; no edema; normal capillary refill; no cyanosis, no calf tenderness or swelling    SKIN: Normal color for age and race; warm; no rash NEURO: Moves all extremities equally PSYCH: The patient's mood and manner are appropriate. Grooming and personal hygiene are appropriate.  MEDICAL DECISION MAKING: Patient here with what appears to be asthma exacerbation. No recent infectious symptoms. Does not appear volume overloaded on exam. At this time  denies any chest pain or chest discomfort. EKG shows no ischemic abnormality. We'll give albuterol, Atrovent, Solu-Medrol and reassess.  ED PROGRESS: Patient's breath sounds have improved but he is still tight and wheezing. His labs are unremarkable including a negative troponin, normal BNP. Chest x-ray is clear. Again suspect that this is asthma causing his symptoms. I do not feel he needs antibiotics.   Patient has  been intermittently hypertensive here in the emergency department. It does appear that he is on Lopressor. He states he's been taking this medication. He denies chest pain, headache, blurry vision, neurologic deficits. We'll continue to monitor.   Patient intermittently has episodes of atrial fibrillation with heart rates in the 160s to 170s but then goes up quickly back into a sinus rhythm. He does appear he has a history of previous atrial fibrillation. We'll monitor closely.   3:06 AM Discussed patient's case with hospitalist, Dr. Julian Reil.  I have recommended admission and patient (and family if present) agree with this plan. Admitting physician will place admission orders.   I reviewed all nursing notes, vitals, pertinent previous records, EKGs, lab and urine results, imaging (as available).   Patient complaining of chest pain whenever he has these episodes of atrial fibrillation. He does not stay and these episodes for more than several minutes before going back into a sinus rhythm. We'll repeat a second troponin. Repeat EKG shows mild diffuse ST depression but no ST elevation. This may be rate related as his heart rate is in the 110s - 120s in sinus tachycardia. He is hypertensive and I will treat him with hydralazine and give his home dose of Lopressor. We'll also give morphine for pain. At this time I do not think that he has dissecting. I do not think this is a pulmonary embolus.   Blood pressure improved slightly after hydralazine. Second troponin is negative. Transported to [facility] for admission. Pain improved after morphine.   EKG Interpretation  Date/Time:  Wednesday September 06 2016 00:17:19 EDT Ventricular Rate:  64 PR Interval:    QRS Duration: 92 QT Interval:  371 QTC Calculation: 383 R Axis:   51 Text Interpretation:  Sinus rhythm Probable left atrial enlargement RSR' in V1 or V2, probably normal variant ST elev, probable normal early repol pattern Confirmed by  Aarilyn Dye,  DO, Peja Allender [phone]) on 09/06/2016 4:58:54 AM         EKG Interpretation  Date/Time:  Wednesday September 06 2016 01:29:35 EDT Ventricular Rate:  167 PR Interval:    QRS Duration: 97 QT Interval:  250 QTC Calculation: 417 R Axis:   49 Text Interpretation:  Atrial fibrillation Repolarization abnormality, prob rate related Baseline wander in lead(s) V1 V3 V4 V5 V6 Confirmed by Simuel Stebner,  DO, Ines Warf [phone]) on 09/06/2016 4:59:24 AM         EKG Interpretation  Date/Time:  Wednesday September 06 2016 03:29:15 EDT Ventricular Rate:  113 PR Interval:    QRS Duration: 94 QT Interval:  364 QTC Calculation: 500 R Axis:   43 Text Interpretation:  Sinus tachycardia LAE, consider biatrial enlargement RSR' in V1 or V2, probably normal variant ST depression, consider ischemia, lateral lds Borderline prolonged QT interval Confirmed by Brylee Berk,  DO, Kathleen Likins ([id number]) on 09/06/2016 5:02:23 AM         CRITICAL CARE Performed by: Raelyn Number   Total critical care time: 45 minutes  Critical care time was exclusive of separately billable procedures and treating other patients.  Critical care was necessary  to treat or prevent imminent or life-threatening deterioration.  Critical care was time spent personally by me on the following activities: development of treatment plan with patient and/or surrogate as well as nursing, discussions with consultants, evaluation of patient's response to treatment, examination of patient, obtaining history from patient or surrogate, ordering and performing treatments and interventions, ordering and review of laboratory studies, ordering and review of radiographic studies, pulse oximetry and re-evaluation of patient's condition.     Layla Maw Paxten Appelt, DO 09/06/16 [phone]

## 2016-09-06 NOTE — Care Management Note (Signed)
Case Management Note  Patient Details  Name: Billy Beard MRN: [id number] Date of Birth: Feb 15, 1971  Subjective/Objective:  Pt presented for chest pain/ Afib with RVR. Hx of Asthma on IV Solumedrol. Post Stress test- awaiting results. Benefits Check completed for Xarelto. CM will provide pt with 30 day free/ co pay card on 09-07-16.                   Action/Plan: S/W  MAKAYLA  @ [employer] # [phone]    XARELTO  20 MG DAILY   COVER- YES  CO-PAY- $ 20.00 AND 90 DAY SUPPLY AT RETAIL $ 60.00  TIER-- 3 DRUG  PRIOR APPROVAL- NO   RIVAROX[place]AN-- NOT ON FORMULARY   PHARMACY ; [facility] AND [facility] Expected Discharge Date:                  Expected Discharge Plan:  Home/Self Care  In-House Referral:  NA  Discharge planning Services  CM Consult, Medication Assistance  Post Acute Care Choice:  NA Choice offered to:  NA  DME Arranged:  N/A DME Agency:  NA  HH Arranged:  NA HH Agency:  NA  Status of Service:  Completed, signed off  If discussed at Long Length of Stay Meetings, dates discussed:    Additional Comments:  Gala Lewandowsky, RN 09/06/2016, 4:42 PM

## 2016-09-07 DIAGNOSIS — R079 Chest pain, unspecified: Secondary | ICD-10-CM | POA: Diagnosis not present

## 2016-09-07 DIAGNOSIS — M940 Chondrocostal junction syndrome [Tietze]: Secondary | ICD-10-CM | POA: Diagnosis present

## 2016-09-07 DIAGNOSIS — N179 Acute kidney failure, unspecified: Secondary | ICD-10-CM

## 2016-09-07 DIAGNOSIS — I480 Paroxysmal atrial fibrillation: Secondary | ICD-10-CM | POA: Diagnosis present

## 2016-09-07 DIAGNOSIS — I249 Acute ischemic heart disease, unspecified: Secondary | ICD-10-CM

## 2016-09-07 DIAGNOSIS — J45901 Unspecified asthma with (acute) exacerbation: Secondary | ICD-10-CM | POA: Diagnosis present

## 2016-09-07 DIAGNOSIS — F17200 Nicotine dependence, unspecified, uncomplicated: Secondary | ICD-10-CM | POA: Diagnosis present

## 2016-09-07 DIAGNOSIS — I10 Essential (primary) hypertension: Secondary | ICD-10-CM | POA: Diagnosis present

## 2016-09-07 DIAGNOSIS — E781 Pure hyperglyceridemia: Secondary | ICD-10-CM | POA: Diagnosis present

## 2016-09-07 DIAGNOSIS — R748 Abnormal levels of other serum enzymes: Secondary | ICD-10-CM | POA: Diagnosis not present

## 2016-09-07 DIAGNOSIS — Z79899 Other long term (current) drug therapy: Secondary | ICD-10-CM | POA: Diagnosis not present

## 2016-09-07 DIAGNOSIS — Z7901 Long term (current) use of anticoagulants: Secondary | ICD-10-CM | POA: Diagnosis not present

## 2016-09-07 DIAGNOSIS — E785 Hyperlipidemia, unspecified: Secondary | ICD-10-CM | POA: Diagnosis present

## 2016-09-07 DIAGNOSIS — I4891 Unspecified atrial fibrillation: Secondary | ICD-10-CM | POA: Diagnosis not present

## 2016-09-07 DIAGNOSIS — J4521 Mild intermittent asthma with (acute) exacerbation: Secondary | ICD-10-CM | POA: Diagnosis not present

## 2016-09-07 DIAGNOSIS — I5032 Chronic diastolic (congestive) heart failure: Secondary | ICD-10-CM | POA: Diagnosis present

## 2016-09-07 DIAGNOSIS — R7303 Prediabetes: Secondary | ICD-10-CM | POA: Diagnosis not present

## 2016-09-07 DIAGNOSIS — M1A9XX0 Chronic gout, unspecified, without tophus (tophi): Secondary | ICD-10-CM | POA: Diagnosis present

## 2016-09-07 DIAGNOSIS — I110 Hypertensive heart disease with heart failure: Secondary | ICD-10-CM | POA: Diagnosis present

## 2016-09-07 DIAGNOSIS — Z7982 Long term (current) use of aspirin: Secondary | ICD-10-CM | POA: Diagnosis not present

## 2016-09-07 DIAGNOSIS — R072 Precordial pain: Secondary | ICD-10-CM | POA: Diagnosis not present

## 2016-09-07 DIAGNOSIS — I214 Non-ST elevation (NSTEMI) myocardial infarction: Secondary | ICD-10-CM

## 2016-09-07 DIAGNOSIS — E119 Type 2 diabetes mellitus without complications: Secondary | ICD-10-CM | POA: Diagnosis present

## 2016-09-07 LAB — CBC
HEMATOCRIT: 44.8 % (ref 39.0–52.0)
Hemoglobin: 14.8 g/dL (ref 13.0–17.0)
MCH: 26.9 pg (ref 26.0–34.0)
MCHC: 33 g/dL (ref 30.0–36.0)
MCV: 81.5 fL (ref 78.0–100.0)
PLATELETS: 222 10*3/uL (ref 150–400)
RBC: 5.5 MIL/uL (ref 4.22–5.81)
RDW: 13.5 % (ref 11.5–15.5)
WBC: 15.3 10*3/uL — AB (ref 4.0–10.5)

## 2016-09-07 LAB — BASIC METABOLIC PANEL
ANION GAP: 11 (ref 5–15)
BUN: 30 mg/dL — ABNORMAL HIGH (ref 6–20)
CALCIUM: 9.6 mg/dL (ref 8.9–10.3)
CO2: 20 mmol/L — ABNORMAL LOW (ref 22–32)
Chloride: 103 mmol/L (ref 101–111)
Creatinine, Ser: 1.27 mg/dL — ABNORMAL HIGH (ref 0.61–1.24)
Glucose, Bld: 285 mg/dL — ABNORMAL HIGH (ref 65–99)
POTASSIUM: 4.8 mmol/L (ref 3.5–5.1)
Sodium: 134 mmol/L — ABNORMAL LOW (ref 135–145)

## 2016-09-07 LAB — TROPONIN I
Troponin I: 0.25 ng/mL (ref <0.03)
Troponin I: 0.22 ng/mL (ref <0.03)

## 2016-09-07 LAB — GLUCOSE, CAPILLARY
GLUCOSE-CAPILLARY: 183 mg/dL — AB (ref 65–99)
Glucose-Capillary: 185 mg/dL — ABNORMAL HIGH (ref 65–99)
Glucose-Capillary: 227 mg/dL — ABNORMAL HIGH (ref 65–99)
Glucose-Capillary: 257 mg/dL — ABNORMAL HIGH (ref 65–99)

## 2016-09-07 LAB — LIPID PANEL
CHOL/HDL RATIO: 4.3 ratio
Cholesterol: 228 mg/dL — ABNORMAL HIGH (ref 0–200)
HDL: 53 mg/dL (ref >40)
LDL Cholesterol: 140 mg/dL — ABNORMAL HIGH (ref 0–99)
TRIGLYCERIDES: 173 mg/dL — AB (ref <150)
VLDL: 35 mg/dL (ref 0–40)

## 2016-09-07 LAB — APTT: APTT: 93 s — AB (ref 24–36)

## 2016-09-07 LAB — MAGNESIUM: Magnesium: 2.3 mg/dL (ref 1.7–2.4)

## 2016-09-07 LAB — HEPARIN LEVEL (UNFRACTIONATED): Heparin Unfractionated: 1.24 IU/mL — ABNORMAL HIGH (ref 0.30–0.70)

## 2016-09-07 MED ORDER — SODIUM CHLORIDE 0.9% FLUSH: 3.0000 mL | INTRAVENOUS | Status: DC | PRN

## 2016-09-07 MED ORDER — SODIUM CHLORIDE 0.9 % IV SOLN: 250.0000 mL | INTRAVENOUS | Status: DC | PRN

## 2016-09-07 MED ORDER — SODIUM CHLORIDE 0.9% FLUSH
3.0000 mL | Freq: Two times a day (BID) | INTRAVENOUS | Status: DC
  Administered 2016-09-07: 3 mL via INTRAVENOUS

## 2016-09-07 MED ORDER — INSULIN ASPART 100 UNIT/ML ~~LOC~~ SOLN: 0.0000 [IU] | Freq: Every day | SUBCUTANEOUS | Status: DC

## 2016-09-07 MED ORDER — SODIUM CHLORIDE 0.9 % WEIGHT BASED INFUSION
3.0000 mL/kg/h | INTRAVENOUS | Status: DC
  Administered 2016-09-08: 3 mL/kg/h via INTRAVENOUS

## 2016-09-07 MED ORDER — HEPARIN (PORCINE) IN NACL 100-0.45 UNIT/ML-% IJ SOLN
1100.0000 [IU]/h | INTRAMUSCULAR | Status: DC
  Administered 2016-09-07: 1100 [IU]/h via INTRAVENOUS
  Filled 2016-09-07: qty 250

## 2016-09-07 MED ORDER — SODIUM CHLORIDE 0.9 % WEIGHT BASED INFUSION
1.0000 mL/kg/h | INTRAVENOUS | Status: DC
  Administered 2016-09-08: 1 mL/kg/h via INTRAVENOUS

## 2016-09-07 MED ORDER — ASPIRIN EC 81 MG PO TBEC
81.0000 mg | DELAYED_RELEASE_TABLET | Freq: Every day | ORAL | Status: DC
  Administered 2016-09-07: 81 mg via ORAL
  Filled 2016-09-07: qty 1

## 2016-09-07 MED ORDER — DILTIAZEM HCL ER COATED BEADS 180 MG PO CP24
180.0000 mg | ORAL_CAPSULE | Freq: Every day | ORAL | Status: DC
  Administered 2016-09-07 – 2016-09-08 (×2): 180 mg via ORAL
  Filled 2016-09-07 (×2): qty 1

## 2016-09-07 MED ORDER — AMIODARONE IV BOLUS ONLY 150 MG/100ML: 150.0000 mg | Freq: Once | INTRAVENOUS | Status: DC

## 2016-09-07 MED ORDER — METOPROLOL TARTRATE 25 MG PO TABS
25.0000 mg | ORAL_TABLET | Freq: Once | ORAL | Status: AC
  Administered 2016-09-07: 25 mg via ORAL
  Filled 2016-09-07: qty 1

## 2016-09-07 MED ORDER — ASPIRIN 81 MG PO CHEW
81.0000 mg | CHEWABLE_TABLET | ORAL | Status: AC
  Administered 2016-09-08: 81 mg via ORAL
  Filled 2016-09-07: qty 1

## 2016-09-07 MED ORDER — LORATADINE 10 MG PO TABS
10.0000 mg | ORAL_TABLET | Freq: Every day | ORAL | Status: DC
  Administered 2016-09-07: 10 mg via ORAL
  Filled 2016-09-07: qty 1

## 2016-09-07 MED ORDER — INSULIN ASPART 100 UNIT/ML ~~LOC~~ SOLN
0.0000 [IU] | Freq: Three times a day (TID) | SUBCUTANEOUS | Status: DC
  Administered 2016-09-07: 8 [IU] via SUBCUTANEOUS
  Administered 2016-09-08: 5 [IU] via SUBCUTANEOUS

## 2016-09-07 NOTE — Progress Notes (Signed)
Progress Note  Patient Name: Billy Beard Date of Encounter: 09/07/2016  Primary Cardiologist: Dr. Jens Som  Subjective   Pt with PAF, associated with chest pain and SOB and RVR.  amio was ordered but he converted prior to administration.  NOW no pain, but he does worry with the episodes due to severity.  SR  Inpatient Medications    Scheduled Meds: . allopurinol  100 mg Oral Daily  . atorvastatin  40 mg Oral q1800  . diltiazem  120 mg Oral Daily  . insulin aspart  0-5 Units Subcutaneous QHS  . insulin aspart  0-9 Units Subcutaneous TID WC  . methylPREDNISolone (SOLU-MEDROL) injection  60 mg Intravenous Q12H  . metoprolol  5 mg Intravenous Once  . metoprolol  100 mg Oral BID  . rivaroxaban  20 mg Oral Q supper  . sodium chloride flush  3 mL Intravenous Q12H   Continuous Infusions:  PRN Meds: acetaminophen **OR** acetaminophen, levalbuterol   Vital Signs    Vitals:   09/06/16 1337 09/06/16 2118 09/07/16 0009 09/07/16 0540  BP: (!) 142/82 122/61 (!) 182/112 126/78  Pulse: 70 65 81 61  Resp: 18     Temp: 98.2 F (36.8 C) 97.6 F (36.4 C) 97.4 F (36.3 C) 97.7 F (36.5 C)  TempSrc: Oral Oral Oral Oral  SpO2: 98% 96% 97% 97%  Weight:    190 lb 6.4 oz (86.4 kg)  Height:        Intake/Output Summary (Last 24 hours) at 09/07/16 0819 Last data filed at 09/07/16 0500  Gross per 24 hour  Intake              720 ml  Output              975 ml  Net             -255 ml   Filed Weights   09/05/16 2358 09/06/16 0558 09/07/16 0540  Weight: 192 lb (87.1 kg) 189 lb 8 oz (86 kg) 190 lb 6.4 oz (86.4 kg)    Telemetry    SR to slight SB and PAF with RVR to 136 - Personally Reviewed  ECG    A fib with RVR and ST depression in inf lat leads as before - Personally Reviewed  Physical Exam   GEN: No acute distress.   Neck: No JVD Cardiac: RRR, no rubs, or gallops, 2/6 systolic murmur Respiratory: Clear to auscultation bilaterally. GI: Soft, nontender, non-distended    MS: No edema; No deformity. Neuro:  Nonfocal  Psych: Normal affect   Labs    Chemistry Recent Labs Lab 09/06/16 0008 09/07/16 0310  NA 137 134*  K 4.1 4.8  CL 102 103  CO2 24 20*  GLUCOSE 168* 285*  BUN 16 30*  CREATININE 1.02 1.27*  CALCIUM 10.4* 9.6  PROT 7.0  --   ALBUMIN 3.8  --   AST 71*  --   ALT 107*  --   ALKPHOS 99  --   BILITOT 0.5  --   GFRNONAA >60 >60  GFRAA >60 >60  ANIONGAP 11 11     Hematology Recent Labs Lab 09/06/16 0008 09/07/16 0310  WBC 7.5 15.3*  RBC 5.83* 5.50  HGB 15.9 14.8  HCT 46.9 44.8  MCV 80.4 81.5  MCH 27.3 26.9  MCHC 33.9 33.0  RDW 13.5 13.5  PLT 219 222    Cardiac Enzymes Recent Labs Lab 09/06/16 0010 09/06/16 0429 09/06/16 0849 09/06/16 2015  TROPONINI <0.03 <  0.03 0.07* 0.32*    BNP Recent Labs Lab 09/06/16 0008  BNP 21.8     DDimer  Recent Labs Lab 09/06/16 0849  DDIMER 0.37     Radiology    Nm Myocar Multi W/spect W/wall Motion / Ef  Result Date: 09/06/2016  There was no ST segment deviation noted during stress.  The study is normal.  This is a low risk study.  The left ventricular ejection fraction is normal (55-65%).    Dg Chest Portable 1 View  Result Date: 09/06/2016 CLINICAL DATA:  Worsening shortness of breath since yesterday. Ran out of inhaler. Nonproductive cough. EXAM: PORTABLE CHEST 1 VIEW COMPARISON:  08/28/2016 FINDINGS: Low lung volumes. Normal heart size and pulmonary vascularity. No focal airspace disease or consolidation in the lungs. No blunting of costophrenic angles. No pneumothorax. Mediastinal contours appear intact. Degenerative changes in the spine and shoulders. IMPRESSION: No active disease. Electronically Signed   By: Burman Nieves M.D.   On: 09/06/2016 01:17    Cardiac Studies   nuc study neg for ischemia.  ECHO 08/30/16 Study Conclusions  - Left ventricle: The cavity size was normal. There was mild   concentric hypertrophy. Systolic function was vigorous.  The   estimated ejection fraction was in the range of 65% to 70%. Wall   motion was normal; there were no regional wall motion   abnormalities. Features are consistent with a pseudonormal left   ventricular filling pattern, with concomitant abnormal relaxation   and increased filling pressure (grade 2 diastolic dysfunction). - Left atrium: The atrium was mildly dilated. Volume/bsa, ES   (1-plane Simpson&'s, A4C): 36.6 ml/m^2. - Pericardium, extracardiac: There was no pericardial effusion.  Impressions:  - Compared to the prior study, there has been no significant   interval change.   Patient Profile     46 y.o. male with a PMH significant for paroxysmal atrial fibrillation, grade 2 diastolic dysfunction, asthma, HTN, and gout. He reported to [facility] with SOB and chest pain.   Echo is stable and nuc is neg for ischemia.   Assessment & Plan    PAF  Continues with runs of A fib RVR  On dilt  CD 120, lopressor 100 mg BID,  May be precipitated by asthma.  Increase dilt vs ablation?  Dr. Jens Som was discussing with EP.   Anticoagulation  CHA2DS2-VASc Score of only 2 (HTN & DM-2). Pt on Xarelto  Abnormal EKG with tachycardia - neg nuc for ischemia, troponin mildly elevated due to tachycardia.  At 0.32  HTN  Elevated with PAF but now 126/78  Chronic diastolic HF stable, euvolemic  HLD on statin   LDL is 140   DM-2  With hgb A1C 6.3  Per IM  Asthma exacerbation per IM on steroids  Improved no symptoms   Signed, Nada Boozer, NP  09/07/2016, 8:19 AM    As above, patient seen and examined. Patient had recurrent atrial fibrillation with rapid ventricular response and there was associated inferior lateral ST depression as well as chest pain. His troponin has increased to 0.32. I am concerned about the possibility of underlying coronary disease despite his normal nuclear study. I think definitive evaluation is warranted. We will hold his xarelto today and resume heparin; resume asa.  Proceed with cardiac catheterization tomorrow. The risks and benefits including myocardial infarction, CVA and death were discussed and he agrees to proceed. Note his renal function is slightly worse. Will hydrate prior to procedure. He continues with paroxysmal atrial fibrillation with rapid  ventricular response. I did review his electrocardiogram with Dr. Johney Frame yesterday and he felt this was likely atrial fibrillation. Continue metoprolol. Increase Cardizem to 180 mg daily. He will likely need an antiarrhythmic. If no coronary disease on catheterization would favor flecainide. Otherwise may need tikosyn. Will need to resume xarelto following procedure.  Olga Millers, MD

## 2016-09-07 NOTE — Progress Notes (Signed)
PROGRESS NOTE                                                                                                                                                                                                             Patient Demographics:    Billy Beard, is a 46 y.o. male, DOB - [birth year], ZOX:[id number]  Admit date - 09/05/2016   Admitting Physician Hillary Bow, DO  Outpatient Primary MD for the patient is Clementeen Graham, MD  LOS - 0  Outpatient Specialists: Dr Jens Som  Chief Complaint  Patient presents with  . Cough       Brief Narrative   46 year old [place] male with history of dyslipidemia with hypertriglyceridemia, chronic transaminitis, gout, hypertension, paroxysmal A. fib, prediabetes, grade 2 diastolic dysfunction and seasonal allergies. He was hospitalized in April 2017 with chest pain which was ruled out for ACS with negative nuclear stress test. He was also found to have paroxysmal A. fib and given low CHADS2vasc score was discharged on aspirin. Patient reports having off-and-on chest discomfort with palpitations for over 6 months. The chest pain is substernal without any radiation, aggravating or relieving factors. Symptoms occur at least once or twice a week. Denies any orthopnea or PND. He was seen in the primary care office on 4/9 for chest pain which was thought to be costochondritis. He had a 2-D echo done as outpatient on 4/10 which showed normal LV function, mild LVH and grade 2 diastolic dysfunction. He presented to the ED with shortness of breath and chest tightness of one-day duration. He also reported [place] ( [place]. Patient was found to be wheezing in the ED and given nebulizer treatment. He was transferred to [facility] and shortly after arrival developed A. fib with RVR.  Patient admitted to telemetry and cardiology  consulted.     Subjective:   Patient reported severe worsened substernal chest pain last night. He went briefly into rapid A. fib but returned to sinus without intervention. Denies any chest pain this morning.   Assessment  & Plan :    Principal Problem: Chest pain with acute coronary syndrome (unstable angina versus NSTEMI) - Recent 2-D echo unremarkable. Lexiscan Myoview done on 4/18-  negative for ischemia. However patient had persistent chest pain and elevated troponin (peaked at  0.32). Also associated inferior lateral ST depression on EKG. - cardiology  on left heart catheterization on 4/20. -Started on IV heparin. Continue beta blocker and statin.  Active Problems: Atrial fibrillation with RVR (HCC) -Continue metoprolol. Cardizem dose increased to 180 mg daily. -Started on Xarelto this admission (held for cardiac cath tomorrow and placed on IV heparin). Continue aspirin. Based on cardiac cath results cardiology will decide on need for antiarrhythmic.  Dyslipidemia Continue statin.  Reactive airway disease Patient denies prior history of asthma. Reports having seasonal allergies triggered by pollen around this time of the year. Last attack of wheezing was in spring of last year.  - discontinue steroid and monitor on when necessary Xopenex. Currently symptom-free.Marland Kitchen  History of tobacco use Reports quitting for almost a year.  Prediabetes A1c of 6.3 needs counseling on diet and exercise. Elevated CBGs secondary to steroid use. Monitor with sliding scale coverage.   Chronic gout Continue allopurinol.  Acute kidney injury Mild. Monitoring a.m.    Code Status : Full code  Family Communication  : None at bedside  Disposition Plan  : Pending cardiac workup  Barriers For Discharge : Active symptoms  Consults  :  Cardiology  Procedures  :  Steffanie Dunn 4/80 LHC on 4/20  DVT Prophylaxis  :  IV  Heparin   Lab Results  Component Value Date   PLT 222  09/07/2016    Antibiotics  :    Anti-infectives    None        Objective:   Vitals:   09/06/16 2118 09/07/16 0009 09/07/16 0540 09/07/16 1329  BP: 122/61 (!) 182/112 126/78 108/60  Pulse: 65 81 61 61  Resp:    18  Temp: 97.6 F (36.4 C) 97.4 F (36.3 C) 97.7 F (36.5 C) 98.1 F (36.7 C)  TempSrc: Oral Oral Oral Oral  SpO2: 96% 97% 97% 97%  Weight:   86.4 kg (190 lb 6.4 oz)   Height:        Wt Readings from Last 3 Encounters:  09/07/16 86.4 kg (190 lb 6.4 oz)  08/28/16 87.1 kg (192 lb)  02/01/16 85.3 kg (188 lb)     Intake/Output Summary (Last 24 hours) at 09/07/16 1554 Last data filed at 09/07/16 1350  Gross per 24 hour  Intake              900 ml  Output             1225 ml  Net             -325 ml     Physical Exam  Gen: not in distress HEENT:  moist mucosa, supple neck Chest: clear b/l, no added sounds CVS: N S1&S2, no murmurs, rubs or gallop GI: soft, NT, ND,  Musculoskeletal: warm, no edema     Data Review:    CBC  Recent Labs Lab 09/06/16 0008 09/07/16 0310  WBC 7.5 15.3*  HGB 15.9 14.8  HCT 46.9 44.8  PLT 219 222  MCV 80.4 81.5  MCH 27.3 26.9  MCHC 33.9 33.0  RDW 13.5 13.5    Chemistries   Recent Labs Lab 09/06/16 0008 09/06/16 0849 09/07/16 0310  NA 137  --  134*  K 4.1  --  4.8  CL 102  --  103  CO2 24  --  20*  GLUCOSE 168*  --  285*  BUN 16  --  30*  CREATININE 1.02  --  1.27*  CALCIUM 10.4*  --  9.6  MG  --  1.6* 2.3  AST 71*  --   --   ALT 107*  --   --   ALKPHOS 99  --   --   BILITOT 0.5  --   --    ------------------------------------------------------------------------------------------------------------------  Recent Labs  09/07/16 0310  CHOL 228*  HDL 53  LDLCALC 140*  TRIG 173*  CHOLHDL 4.3    Lab Results  Component Value Date   HGBA1C 6.3 (H) 09/06/2016   ------------------------------------------------------------------------------------------------------------------  Recent Labs   09/06/16 0950  TSH 1.610   ------------------------------------------------------------------------------------------------------------------ No results for input(s): VITAMINB12, FOLATE, FERRITIN, TIBC, IRON, RETICCTPCT in the last 72 hours.  Coagulation profile No results for input(s): INR, PROTIME in the last 168 hours.   Recent Labs  09/06/16 0849  DDIMER 0.37    Cardiac Enzymes  Recent Labs Lab 09/06/16 0849 09/06/16 2015 09/07/16 1034  TROPONINI 0.07* 0.32* 0.25*   ------------------------------------------------------------------------------------------------------------------    Component Value Date/Time   BNP 21.8 09/06/2016 0008    Inpatient Medications  Scheduled Meds: . allopurinol  100 mg Oral Daily  . aspirin EC  81 mg Oral Daily  . atorvastatin  40 mg Oral q1800  . diltiazem  180 mg Oral Daily  . insulin aspart  0-5 Units Subcutaneous QHS  . insulin aspart  0-9 Units Subcutaneous TID WC  . methylPREDNISolone (SOLU-MEDROL) injection  60 mg Intravenous Q12H  . metoprolol  5 mg Intravenous Once  . metoprolol  100 mg Oral BID  . sodium chloride flush  3 mL Intravenous Q12H   Continuous Infusions: . sodium chloride    . heparin 1,100 Units/hr (09/07/16 1128)   PRN Meds:.sodium chloride, acetaminophen **OR** acetaminophen, levalbuterol  Micro Results No results found for this or any previous visit (from the past 240 hour(s)).  Radiology Reports Dg Chest 2 View  Result Date: 08/29/2016 CLINICAL DATA:  Left side chest pain EXAM: CHEST  2 VIEW COMPARISON:  09/08/2015 FINDINGS: Heart and mediastinal contours are within normal limits. No focal opacities or effusions. No acute bony abnormality. IMPRESSION: No active cardiopulmonary disease. Electronically Signed   By: Charlett Nose M.D.   On: 08/29/2016 07:52   Nm Myocar Multi W/spect W/wall Motion / Ef  Result Date: 09/06/2016  There was no ST segment deviation noted during stress.  The study is  normal.  This is a low risk study.  The left ventricular ejection fraction is normal (55-65%).    Dg Chest Portable 1 View  Result Date: 09/06/2016 CLINICAL DATA:  Worsening shortness of breath since yesterday. Ran out of inhaler. Nonproductive cough. EXAM: PORTABLE CHEST 1 VIEW COMPARISON:  08/28/2016 FINDINGS: Low lung volumes. Normal heart size and pulmonary vascularity. No focal airspace disease or consolidation in the lungs. No blunting of costophrenic angles. No pneumothorax. Mediastinal contours appear intact. Degenerative changes in the spine and shoulders. IMPRESSION: No active disease. Electronically Signed   By: Burman Nieves M.D.   On: 09/06/2016 01:17    Time Spent in minutes  25   Eddie North M.D on 09/07/2016 at 3:54 PM  Between 7am to 7pm - Pager - [phone]  After 7pm go to [email or web address] - password [facility]  [facility] -  Office  [phone]

## 2016-09-07 NOTE — Progress Notes (Signed)
ANTICOAGULATION CONSULT NOTE - Initial Consult  Pharmacy Consult for Heparin Indication: atrial fibrillation and possible CAD  No Known Allergies  Patient Measurements: Height:  (160 cm) Weight: 190 lb 6.4 oz (86.4 kg) IBW/kg (Calculated) : 56.9  Vital Signs: Temp: 98.1 F (36.7 C) (04/19 1329) Temp Source: Oral (04/19 1329) BP: 108/60 (04/19 1329) Pulse Rate: 61 (04/19 1329)  Labs:  Recent Labs  09/06/16 0008  09/06/16 2015 09/07/16 0310 09/07/16 1034 09/07/16 1534 09/07/16 2003  HGB 15.9  --   --  14.8  --   --   --   HCT 46.9  --   --  44.8  --   --   --   PLT 219  --   --  222  --   --   --   APTT  --   --   --   --   --   --  93*  HEPARINUNFRC  --   --   --   --   --   --  1.24*  CREATININE 1.02  --   --  1.27*  --   --   --   TROPONINI  --   < > 0.32*  --  0.25* 0.22*  --   < > = values in this interval not displayed.  Estimated Creatinine Clearance: 72.9 mL/min (A) (by C-G formula based on SCr of 1.27 mg/dL (H)).   Medical History: Past Medical History:  Diagnosis Date  . Asthma   . Atrial fibrillation (HCC)   . Gout   . Hypertension     Medications:  Scheduled:  . allopurinol  100 mg Oral Daily  . [START ON 09/08/2016] aspirin  81 mg Oral Pre-Cath  . aspirin EC  81 mg Oral Daily  . atorvastatin  40 mg Oral q1800  . diltiazem  180 mg Oral Daily  . insulin aspart  0-15 Units Subcutaneous TID WC  . insulin aspart  0-5 Units Subcutaneous QHS  . metoprolol  5 mg Intravenous Once  . metoprolol  100 mg Oral BID  . sodium chloride flush  3 mL Intravenous Q12H  . sodium chloride flush  3 mL Intravenous Q12H    Assessment: 45yo male started on Xarelto last PM, received first dose ~6PM, and now to transition to heparin due to ongoing episodes of AFib with RVR and potential for underlying CAD.  Plan for cath on 4/20.  Will start heparin now as pt has only had one dose of Xarelto.  Heparin level falsely elevated at 1.24 due to recent Xarelto. Using  aPTT for now due to interaction with Xarelto and heparin level.   APTT is 93 - at goal of 66 to 102 on 1100 units/hr.   Goal of Therapy:  Heparin level 0.3-0.7 units/ml  aPTT 66-102 sec Monitor platelets by anticoagulation protocol: Yes   Plan:  Continue Heparin 1100 units/hr Recheck aPTT 6hr Daily Heparin level, aPTT Daily CBC Watch for s/s of bleeding   Link Snuffer, PharmD, BCPS Clinical Pharmacist Clinical phone 09/07/2016 until 2300 PM - #[phone] After hours, please call [phone] 09/07/2016 , 9:48 PM

## 2016-09-07 NOTE — Progress Notes (Signed)
ANTICOAGULATION CONSULT NOTE - Initial Consult  Pharmacy Consult for Heparin Indication: atrial fibrillation and possible CAD  No Known Allergies  Patient Measurements: Height:  (160 cm) Weight: 190 lb 6.4 oz (86.4 kg) IBW/kg (Calculated) : 56.9  Vital Signs: Temp: 97.7 F (36.5 C) (04/19 0540) Temp Source: Oral (04/19 0540) BP: 126/78 (04/19 0540) Pulse Rate: 61 (04/19 0540)  Labs:  Recent Labs  09/06/16 0008  09/06/16 0429 09/06/16 0849 09/06/16 2015 09/07/16 0310  HGB 15.9  --   --   --   --  14.8  HCT 46.9  --   --   --   --  44.8  PLT 219  --   --   --   --  222  CREATININE 1.02  --   --   --   --  1.27*  TROPONINI  --   < > <0.03 0.07* 0.32*  --   < > = values in this interval not displayed.  Estimated Creatinine Clearance: 72.9 mL/min (A) (by C-G formula based on SCr of 1.27 mg/dL (H)).   Medical History: Past Medical History:  Diagnosis Date  . Asthma   . Atrial fibrillation (HCC)   . Gout   . Hypertension     Medications:  Scheduled:  . allopurinol  100 mg Oral Daily  . aspirin EC  81 mg Oral Daily  . atorvastatin  40 mg Oral q1800  . diltiazem  180 mg Oral Daily  . insulin aspart  0-5 Units Subcutaneous QHS  . insulin aspart  0-9 Units Subcutaneous TID WC  . methylPREDNISolone (SOLU-MEDROL) injection  60 mg Intravenous Q12H  . metoprolol  5 mg Intravenous Once  . metoprolol  100 mg Oral BID  . sodium chloride flush  3 mL Intravenous Q12H    Assessment: 45yo male started on Xarelto last PM, received first dose ~6PM, and now to transition to heparin due to ongoing episodes of AFib with RVR and potential for underlying CAD.  Plan for cath on 4/20.  Will start heparin now as pt has only had one dose of Xarelto.  Goal of Therapy:  Heparin level 0.3-0.7 units/ml  aPTT 66-102 sec Monitor platelets by anticoagulation protocol: Yes   Plan:  No bolus due to Xarelto dose last night Heparin 1100 units/hr Heparin level and aPTT 8hr Daily  Heparin level, aPTT Daily CBC Watch for s/s of bleeding   Marisue Humble, PharmD Clinical Pharmacist [facility]

## 2016-09-07 NOTE — Progress Notes (Signed)
Patient had an episode of symptomatic AfibRVR. EKG confirmed rhythm. Cardiology paged, MD placed order for amio bolus. Before given patient converted back to sinus rhythm. Bolus D/C'd and order recevied for  PO metoprolol. With conversion, patient states relief from chest pain/panic.

## 2016-09-07 NOTE — Plan of Care (Signed)
Problem: Education: Goal: Knowledge of [place] [facility] Education information/materials will improve Outcome: Progressing Patient needs continued education on disease processes, he is receptive to RN's education

## 2016-09-08 ENCOUNTER — Encounter (HOSPITAL_COMMUNITY): Payer: Self-pay | Admitting: Interventional Cardiology

## 2016-09-08 ENCOUNTER — Encounter (HOSPITAL_COMMUNITY)
Admission: EM | Disposition: A | Discharge status: Home / Self Care | Payer: Self-pay | Source: Home / Self Care | Attending: Internal Medicine

## 2016-09-08 DIAGNOSIS — R748 Abnormal levels of other serum enzymes: Secondary | ICD-10-CM

## 2016-09-08 DIAGNOSIS — R072 Precordial pain: Secondary | ICD-10-CM

## 2016-09-08 HISTORY — PX: LEFT HEART CATH AND CORONARY ANGIOGRAPHY: CATH118249

## 2016-09-08 LAB — BASIC METABOLIC PANEL
Anion gap: 12 (ref 5–15)
BUN: 31 mg/dL — AB (ref 6–20)
CHLORIDE: 103 mmol/L (ref 101–111)
CO2: 20 mmol/L — AB (ref 22–32)
CREATININE: 1.27 mg/dL — AB (ref 0.61–1.24)
Calcium: 9 mg/dL (ref 8.9–10.3)
GFR calc Af Amer: >60 mL/min (ref >60)
GFR calc non Af Amer: >60 mL/min (ref >60)
GLUCOSE: 323 mg/dL — AB (ref 65–99)
Potassium: 4.3 mmol/L (ref 3.5–5.1)
Sodium: 135 mmol/L (ref 135–145)

## 2016-09-08 LAB — CBC
HCT: 42.8 % (ref 39.0–52.0)
Hemoglobin: 13.6 g/dL (ref 13.0–17.0)
MCH: 26.2 pg (ref 26.0–34.0)
MCHC: 31.8 g/dL (ref 30.0–36.0)
MCV: 82.3 fL (ref 78.0–100.0)
PLATELETS: 229 10*3/uL (ref 150–400)
RBC: 5.2 MIL/uL (ref 4.22–5.81)
RDW: 13.9 % (ref 11.5–15.5)
WBC: 16.3 10*3/uL — ABNORMAL HIGH (ref 4.0–10.5)

## 2016-09-08 LAB — GLUCOSE, CAPILLARY
GLUCOSE-CAPILLARY: 235 mg/dL — AB (ref 65–99)
Glucose-Capillary: 192 mg/dL — ABNORMAL HIGH (ref 65–99)

## 2016-09-08 LAB — PROTIME-INR
INR: 1.08
Prothrombin Time: 14.1 seconds (ref 11.4–15.2)

## 2016-09-08 LAB — HEPARIN LEVEL (UNFRACTIONATED): HEPARIN UNFRACTIONATED: 0.7 [IU]/mL (ref 0.30–0.70)

## 2016-09-08 LAB — APTT: aPTT: 70 seconds — ABNORMAL HIGH (ref 24–36)

## 2016-09-08 SURGERY — LEFT HEART CATH AND CORONARY ANGIOGRAPHY
Anesthesia: LOCAL

## 2016-09-08 MED ORDER — SODIUM CHLORIDE 0.9 % IV SOLN: 250.0000 mL | INTRAVENOUS | Status: DC | PRN

## 2016-09-08 MED ORDER — HEPARIN (PORCINE) IN NACL 2-0.9 UNIT/ML-% IJ SOLN
INTRAMUSCULAR | Status: AC
Start: 2016-09-08 — End: 2016-09-08
  Filled 2016-09-08: qty 1000

## 2016-09-08 MED ORDER — FENTANYL CITRATE (PF) 100 MCG/2ML IJ SOLN
INTRAMUSCULAR | Status: AC
  Filled 2016-09-08: qty 2

## 2016-09-08 MED ORDER — SODIUM CHLORIDE 0.9% FLUSH: 3.0000 mL | INTRAVENOUS | Status: DC | PRN

## 2016-09-08 MED ORDER — LIDOCAINE HCL (PF) 1 % IJ SOLN
INTRAMUSCULAR | Status: AC
  Filled 2016-09-08: qty 30

## 2016-09-08 MED ORDER — VERAPAMIL HCL 2.5 MG/ML IV SOLN
INTRAVENOUS | Status: AC
  Filled 2016-09-08: qty 2

## 2016-09-08 MED ORDER — LIDOCAINE HCL (PF) 1 % IJ SOLN
INTRAMUSCULAR | Status: DC | PRN
  Administered 2016-09-08: 2 mL

## 2016-09-08 MED ORDER — HEPARIN SODIUM (PORCINE) 1000 UNIT/ML IJ SOLN
INTRAMUSCULAR | Status: AC
  Filled 2016-09-08: qty 1

## 2016-09-08 MED ORDER — MIDAZOLAM HCL 2 MG/2ML IJ SOLN
INTRAMUSCULAR | Status: AC
  Filled 2016-09-08: qty 2

## 2016-09-08 MED ORDER — ALBUTEROL SULFATE HFA 108 (90 BASE) MCG/ACT IN AERS: 2.0000 | INHALATION_SPRAY | Freq: Four times a day (QID) | RESPIRATORY_TRACT | 0 refills | Status: DC | PRN

## 2016-09-08 MED ORDER — VERAPAMIL HCL 2.5 MG/ML IV SOLN
INTRAVENOUS | Status: DC | PRN
  Administered 2016-09-08: 10 mL via INTRA_ARTERIAL

## 2016-09-08 MED ORDER — HEPARIN (PORCINE) IN NACL 2-0.9 UNIT/ML-% IJ SOLN
INTRAMUSCULAR | Status: DC | PRN
  Administered 2016-09-08: 1000 mL

## 2016-09-08 MED ORDER — OXYCODONE-ACETAMINOPHEN 5-325 MG PO TABS: 1.0000 | ORAL_TABLET | ORAL | Status: DC | PRN

## 2016-09-08 MED ORDER — MIDAZOLAM HCL 2 MG/2ML IJ SOLN
INTRAMUSCULAR | Status: DC | PRN
  Administered 2016-09-08: 1 mg via INTRAVENOUS

## 2016-09-08 MED ORDER — IOPAMIDOL (ISOVUE-370) INJECTION 76%
INTRAVENOUS | Status: AC
  Filled 2016-09-08: qty 100

## 2016-09-08 MED ORDER — DILTIAZEM HCL ER COATED BEADS 180 MG PO CP24: 180.0000 mg | ORAL_CAPSULE | Freq: Every day | ORAL | 0 refills | Status: DC

## 2016-09-08 MED ORDER — BUDESONIDE-FORMOTEROL FUMARATE 160-4.5 MCG/ACT IN AERO: 2.0000 | INHALATION_SPRAY | Freq: Two times a day (BID) | RESPIRATORY_TRACT | 0 refills | Status: DC

## 2016-09-08 MED ORDER — SODIUM CHLORIDE 0.9 % IV SOLN
INTRAVENOUS | Status: AC
  Administered 2016-09-08: 09:00:00 via INTRAVENOUS

## 2016-09-08 MED ORDER — AEROCHAMBER PLUS FLO-VU MEDIUM MISC
1.0000 | Freq: Once | Status: AC
  Administered 2016-09-08: 1
  Filled 2016-09-08: qty 1

## 2016-09-08 MED ORDER — FENTANYL CITRATE (PF) 100 MCG/2ML IJ SOLN
INTRAMUSCULAR | Status: DC | PRN
  Administered 2016-09-08: 50 ug via INTRAVENOUS

## 2016-09-08 MED ORDER — IOPAMIDOL (ISOVUE-370) INJECTION 76%
INTRAVENOUS | Status: DC | PRN
  Administered 2016-09-08: 60 mL via INTRA_ARTERIAL

## 2016-09-08 MED ORDER — HEPARIN SODIUM (PORCINE) 1000 UNIT/ML IJ SOLN
INTRAMUSCULAR | Status: DC | PRN
  Administered 2016-09-08: 4500 [IU] via INTRAVENOUS

## 2016-09-08 MED ORDER — SODIUM CHLORIDE 0.9% FLUSH: 3.0000 mL | Freq: Two times a day (BID) | INTRAVENOUS | Status: DC

## 2016-09-08 MED ORDER — ACETAMINOPHEN 325 MG PO TABS: 650.0000 mg | ORAL_TABLET | ORAL | Status: DC | PRN

## 2016-09-08 MED ORDER — ONDANSETRON HCL 4 MG/2ML IJ SOLN: 4.0000 mg | Freq: Four times a day (QID) | INTRAMUSCULAR | Status: DC | PRN

## 2016-09-08 MED ORDER — RIVAROXABAN 20 MG PO TABS: 20.0000 mg | ORAL_TABLET | Freq: Every day | ORAL | 0 refills | Status: DC

## 2016-09-08 SURGICAL SUPPLY — 13 items
CATH INFINITI 5 FR JL3.5 (CATHETERS) ×2 IMPLANT
CATH INFINITI JR4 5F (CATHETERS) ×2 IMPLANT
COVER PRB 48X5XTLSCP FOLD TPE (BAG) ×1 IMPLANT
COVER PROBE 5X48 (BAG) ×1
DEVICE RAD COMP TR BAND LRG (VASCULAR PRODUCTS) ×2 IMPLANT
GLIDESHEATH SLEND A-KIT 6F 22G (SHEATH) ×2 IMPLANT
GUIDEWIRE INQWIRE 1.5J.035X260 (WIRE) ×1 IMPLANT
INQWIRE 1.5J .035X260CM (WIRE) ×2
KIT HEART LEFT (KITS) ×2 IMPLANT
KIT MICROINTRODUCER STIFF 5F (SHEATH) ×2 IMPLANT
PACK CARDIAC CATHETERIZATION (CUSTOM PROCEDURE TRAY) ×2 IMPLANT
TRANSDUCER W/STOPCOCK (MISCELLANEOUS) ×2 IMPLANT
TUBING CIL FLEX 10 FLL-RA (TUBING) ×2 IMPLANT

## 2016-09-08 NOTE — Plan of Care (Signed)
Problem: Education: Goal: Knowledge of [place] [facility] Education information/materials will improve Outcome: Progressing Patient accessed video education channel to learn more about cath and his diagnosis

## 2016-09-08 NOTE — Progress Notes (Signed)
Progress Note  Patient Name: Billy Beard Date of Encounter: 09/08/2016  Primary Cardiologist: Dr. Jens Som  Subjective   Pt denies CP or dyspnea  Inpatient Medications    Scheduled Meds: . allopurinol  100 mg Oral Daily  . aspirin EC  81 mg Oral Daily  . atorvastatin  40 mg Oral q1800  . diltiazem  180 mg Oral Daily  . insulin aspart  0-15 Units Subcutaneous TID WC  . insulin aspart  0-5 Units Subcutaneous QHS  . loratadine  10 mg Oral QHS  . metoprolol  5 mg Intravenous Once  . metoprolol  100 mg Oral BID  . sodium chloride flush  3 mL Intravenous Q12H   Continuous Infusions:  PRN Meds: acetaminophen **OR** acetaminophen, levalbuterol   Vital Signs    Vitals:   09/08/16 0739 09/08/16 0744 09/08/16 0749 09/08/16 0754  BP: 122/78 131/79 128/79 120/81  Pulse: (!) 45 (!) 46 (!) 46 (!) 45  Resp: Temp:      TempSrc:      SpO2: 99% 96% 97% 96%  Weight:      Height:        Intake/Output Summary (Last 24 hours) at 09/08/16 0855 Last data filed at 09/08/16 0649  Gross per 24 hour  Intake          1238.78 ml  Output             1450 ml  Net          -211.22 ml   Filed Weights   09/06/16 0558 09/07/16 0540 09/08/16 0545  Weight: 189 lb 8 oz (86 kg) 190 lb 6.4 oz (86.4 kg) 193 lb 9.6 oz (87.8 kg)    Telemetry    Sinus bradycardia with PAF, rate controlled - Personally Reviewed  Physical Exam   GEN: No acute distress.  WD/WN Neck: Supple Cardiac: regular and bradycardic Respiratory: CTA, no wheeze GI: Soft, nontender, non-distended  MS: No edema; Radial cath site with TR band Neuro:  Nonfocal  Psych: Normal affect   Labs    Chemistry  Recent Labs Lab 09/06/16 0008 09/07/16 0310 09/08/16 0307  NA 137 134* 135  K 4.1 4.8 4.3  CL 102 103 103  CO2 24 20* 20*  GLUCOSE 168* 285* 323*  BUN 16 30* 31*  CREATININE 1.02 1.27* 1.27*  CALCIUM 10.4* 9.6 9.0  PROT 7.0  --   --   ALBUMIN 3.8  --   --   AST 71*  --   --   ALT 107*  --    --   ALKPHOS 99  --   --   BILITOT 0.5  --   --   GFRNONAA >60 >60 >60  GFRAA >60 >60 >60  ANIONGAP Hematology  Recent Labs Lab 09/06/16 0008 09/07/16 0310 09/08/16 0307  WBC 7.5 15.3* 16.3*  RBC 5.83* 5.50 5.20  HGB 15.9 14.8 13.6  HCT 46.9 44.8 42.8  MCV 80.4 81.5 82.3  MCH 27.3 26.9 26.2  MCHC 33.9 33.0 31.8  RDW 13.5 13.5 13.9  PLT 219 222 229    Cardiac Enzymes  Recent Labs Lab 09/06/16 0849 09/06/16 2015 09/07/16 1034 09/07/16 1534  TROPONINI 0.07* 0.32* 0.25* 0.22*    BNP  Recent Labs Lab 09/06/16 0008  BNP 21.8     DDimer   Recent Labs Lab 09/06/16 0849  DDIMER 0.37     Radiology  Nm Myocar Multi W/spect W/wall Motion / Ef  Result Date: 09/06/2016  There was no ST segment deviation noted during stress.  The study is normal.  This is a low risk study.  The left ventricular ejection fraction is normal (55-65%).     Cardiac Studies   nuc study neg for ischemia.  ECHO 08/30/16 Study Conclusions  - Left ventricle: The cavity size was normal. There was mild   concentric hypertrophy. Systolic function was vigorous. The   estimated ejection fraction was in the range of 65% to 70%. Wall   motion was normal; there were no regional wall motion   abnormalities. Features are consistent with a pseudonormal left   ventricular filling pattern, with concomitant abnormal relaxation   and increased filling pressure (grade 2 diastolic dysfunction). - Left atrium: The atrium was mildly dilated. Volume/bsa, ES   (1-plane Simpson&'s, A4C): 36.6 ml/m^2. - Pericardium, extracardiac: There was no pericardial effusion.  Impressions:  - Compared to the prior study, there has been no significant   interval change.   Patient Profile     46 y.o. male with a PMH significant for paroxysmal atrial fibrillation, grade 2 diastolic dysfunction, asthma, HTN, and gout. He reported to [facility] with SOB and chest pain.  Assessment & Plan      PAF  Pt  In sinus this AM; had Afib briefly on telemetry but rate controlled. Continue metoprolol and cardizem; resume xarelto 20 mg daily in AM. DC asa. Pt can be DCed from a cardiac standpoint and fu with me next week as scheduled. If recurrent PAF with add flecanide.  Chest pain-occurred with PAF and tachycardia - cath with normal cors.  HTN  BP controlled; continue present meds.  Chronic diastolic HF euvolemic on exam  HLD continue statin.  DM-2  Per IM  Asthma exacerbation per IM on steroids   Renal insuff (acute)-Cr stable this AM; I will check BMET at ov next week.  We will sign off. Please call with questions.   Signed, Olga Millers, MD  09/08/2016, 8:55 AM

## 2016-09-08 NOTE — H&P (View-Only) (Signed)
 Progress Note  Patient Name: Billy Beard Date of Encounter: 09/07/2016  Primary Cardiologist: Dr. Crenshaw  Subjective   Pt with PAF, associated with chest pain and SOB and RVR.  amio was ordered but he converted prior to administration.  NOW no pain, but he does worry with the episodes due to severity.  SR  Inpatient Medications    Scheduled Meds: . allopurinol  100 mg Oral Daily  . atorvastatin  40 mg Oral q1800  . diltiazem  120 mg Oral Daily  . insulin aspart  0-5 Units Subcutaneous QHS  . insulin aspart  0-9 Units Subcutaneous TID WC  . methylPREDNISolone (SOLU-MEDROL) injection  60 mg Intravenous Q12H  . metoprolol  5 mg Intravenous Once  . metoprolol  100 mg Oral BID  . rivaroxaban  20 mg Oral Q supper  . sodium chloride flush  3 mL Intravenous Q12H   Continuous Infusions:  PRN Meds: acetaminophen **OR** acetaminophen, levalbuterol   Vital Signs    Vitals:   09/06/16 1337 09/06/16 2118 09/07/16 0009 09/07/16 0540  BP: (!) 142/82 122/61 (!) 182/112 126/78  Pulse: 70 65 81 61  Resp: 18     Temp: 98.2 F (36.8 C) 97.6 F (36.4 C) 97.4 F (36.3 C) 97.7 F (36.5 C)  TempSrc: Oral Oral Oral Oral  SpO2: 98% 96% 97% 97%  Weight:    190 lb 6.4 oz (86.4 kg)  Height:        Intake/Output Summary (Last 24 hours) at 09/07/16 0819 Last data filed at 09/07/16 0500  Gross per 24 hour  Intake              720 ml  Output              975 ml  Net             -255 ml   Filed Weights   09/05/16 2358 09/06/16 0558 09/07/16 0540  Weight: 192 lb (87.1 kg) 189 lb 8 oz (86 kg) 190 lb 6.4 oz (86.4 kg)    Telemetry    SR to slight SB and PAF with RVR to 136 - Personally Reviewed  ECG    A fib with RVR and ST depression in inf lat leads as before - Personally Reviewed  Physical Exam   GEN: No acute distress.   Neck: No JVD Cardiac: RRR, no rubs, or gallops, 2/6 systolic murmur Respiratory: Clear to auscultation bilaterally. GI: Soft, nontender, non-distended    MS: No edema; No deformity. Neuro:  Nonfocal  Psych: Normal affect   Labs    Chemistry Recent Labs Lab 09/06/16 0008 09/07/16 0310  NA 137 134*  K 4.1 4.8  CL 102 103  CO2 24 20*  GLUCOSE 168* 285*  BUN 16 30*  CREATININE 1.02 1.27*  CALCIUM 10.4* 9.6  PROT 7.0  --   ALBUMIN 3.8  --   AST 71*  --   ALT 107*  --   ALKPHOS 99  --   BILITOT 0.5  --   GFRNONAA >60 >60  GFRAA >60 >60  ANIONGAP 11 11     Hematology Recent Labs Lab 09/06/16 0008 09/07/16 0310  WBC 7.5 15.3*  RBC 5.83* 5.50  HGB 15.9 14.8  HCT 46.9 44.8  MCV 80.4 81.5  MCH 27.3 26.9  MCHC 33.9 33.0  RDW 13.5 13.5  PLT 219 222    Cardiac Enzymes Recent Labs Lab 09/06/16 0010 09/06/16 0429 09/06/16 0849 09/06/16 2015  TROPONINI <0.03 <  0.03 0.07* 0.32*    BNP Recent Labs Lab 09/06/16 0008  BNP 21.8     DDimer  Recent Labs Lab 09/06/16 0849  DDIMER 0.37     Radiology    Nm Myocar Multi W/spect W/wall Motion / Ef  Result Date: 09/06/2016  There was no ST segment deviation noted during stress.  The study is normal.  This is a low risk study.  The left ventricular ejection fraction is normal (55-65%).    Dg Chest Portable 1 View  Result Date: 09/06/2016 CLINICAL DATA:  Worsening shortness of breath since yesterday. Ran out of inhaler. Nonproductive cough. EXAM: PORTABLE CHEST 1 VIEW COMPARISON:  08/28/2016 FINDINGS: Low lung volumes. Normal heart size and pulmonary vascularity. No focal airspace disease or consolidation in the lungs. No blunting of costophrenic angles. No pneumothorax. Mediastinal contours appear intact. Degenerative changes in the spine and shoulders. IMPRESSION: No active disease. Electronically Signed   By: William  Stevens M.D.   On: 09/06/2016 01:17    Cardiac Studies   nuc study neg for ischemia.  ECHO 08/30/16 Study Conclusions  - Left ventricle: The cavity size was normal. There was mild   concentric hypertrophy. Systolic function was vigorous.  The   estimated ejection fraction was in the range of 65% to 70%. Wall   motion was normal; there were no regional wall motion   abnormalities. Features are consistent with a pseudonormal left   ventricular filling pattern, with concomitant abnormal relaxation   and increased filling pressure (grade 2 diastolic dysfunction). - Left atrium: The atrium was mildly dilated. Volume/bsa, ES   (1-plane Simpson&'s, A4C): 36.6 ml/m^2. - Pericardium, extracardiac: There was no pericardial effusion.  Impressions:  - Compared to the prior study, there has been no significant   interval change.   Patient Profile     46 y.o. male with a PMH significant for paroxysmal atrial fibrillation, grade 2 diastolic dysfunction, asthma, HTN, and gout. He reported to [facility] with SOB and chest pain.   Echo is stable and nuc is neg for ischemia.   Assessment & Plan    PAF  Continues with runs of A fib RVR  On dilt  CD 120, lopressor 100 mg BID,  May be precipitated by asthma.  Increase dilt vs ablation?  Dr. Crenshaw was discussing with EP.   Anticoagulation  CHA2DS2-VASc Score of only 2 (HTN & DM-2). Pt on Xarelto  Abnormal EKG with tachycardia - neg nuc for ischemia, troponin mildly elevated due to tachycardia.  At 0.32  HTN  Elevated with PAF but now 126/78  Chronic diastolic HF stable, euvolemic  HLD on statin   LDL is 140   DM-2  With hgb A1C 6.3  Per IM  Asthma exacerbation per IM on steroids  Improved no symptoms   Signed, Laura Ingold, NP  09/07/2016, 8:19 AM    As above, patient seen and examined. Patient had recurrent atrial fibrillation with rapid ventricular response and there was associated inferior lateral ST depression as well as chest pain. His troponin has increased to 0.32. I am concerned about the possibility of underlying coronary disease despite his normal nuclear study. I think definitive evaluation is warranted. We will hold his xarelto today and resume heparin; resume asa.  Proceed with cardiac catheterization tomorrow. The risks and benefits including myocardial infarction, CVA and death were discussed and he agrees to proceed. Note his renal function is slightly worse. Will hydrate prior to procedure. He continues with paroxysmal atrial fibrillation with rapid   ventricular response. I did review his electrocardiogram with Dr. Allred yesterday and he felt this was likely atrial fibrillation. Continue metoprolol. Increase Cardizem to 180 mg daily. He will likely need an antiarrhythmic. If no coronary disease on catheterization would favor flecainide. Otherwise may need tikosyn. Will need to resume xarelto following procedure.  Brian Crenshaw, MD  

## 2016-09-08 NOTE — Progress Notes (Addendum)
The patient has been given discharge paper work along with a new medication list and what to take today. Cardiac cath site care instructions and follow up appointments. He has a note from the MD for work and paper prescriptions. He is discharging with his family via car.   Sheppard Evens RN

## 2016-09-08 NOTE — Discharge Summary (Signed)
PATIENT DETAILS Name: Billy Beard Age: 46 y.o. Sex: male Date of Birth: Jul 29, 1970 MRN: [id number]. Admitting Physician: Hillary Bow, DO WUJ:WJXB Denyse Amass, MD  Admit Date: 09/05/2016 Discharge date: 09/08/2016  Recommendations for Outpatient Follow-up:  1. Follow up with PCP in 1-2 weeks 2. Please obtain BMP/CBC in one week 3. Please ensure follow-up with cardiology 4. Encourage/counseled regarding importance of avoiding chewed tobacco, and smoking. 5. Probably will need outpatient polysomnography to rule out sleep apnea. 6. Consider referral to pulmonology for outpatient pulmonary function test with methacholine challenge 7. Repeat A1c in the next 3-6 months-if further increase-consider adding metformin or other oral hypoglycemic agents.  Admitted From:  Home  Disposition: Home    Home Health: No  Equipment/Devices: None  Discharge Condition: Stable  CODE STATUS: FULL CODE  Diet recommendation:  Heart Healthy  Brief Summary: See H&P, Labs, Consult and Test reports for all details in brief, patient is a 46 year old male with history of asthma, paroxysmal atrial fibrillation, patient was admitted for evaluation of shortness of breath, hospital course was complicated by development of A. fib RVR and elevated troponins. Underwent a workup-see below for further details.  Brief Hospital Course: Chest pain: Evaluated by cardiology, and underwent a nuclear stress test that was negative for ischemia. However patient continued to have persistent chest pain and subsequently had elevated troponin levels. This prompted a cardiac catheterization on 4/20 which was stated normal coronaries. Recommendations from cardiology are to continue anticoagulation (for PAF), statin and her metoprolol. Chest pain-free at the time of discharge.  Atrial fibrillation with RVR: Sinus rhythm this morning, rate currently controlled with metoprolol and Cardizem. Has now been started on Xarelto.  Cardiology contemplating adding flecainide if he continues to have issues with A. fib in the future. Outpatient follow-up with cardiology has been arranged. Needs outpatient follow-up with cardiology  Asthma exacerbation: I suspect patient has asthma-probably triggered by allergies-he had a similar episode last year as well. He was treated with bronchodilators and a brief course of steroids. I will place him on Symbicort and as necessary albuterol inhaler. If he continues to do well, this therapy can be "stepped down". Will likely need a outpatient pulmonary evaluation for PFTs with possible methacholine challenge. We will defer this to patient's primary care practitioner  Mild acute kidney injury: Likely hemodynamic mediated mild acute kidney injury-cardiology planning on checking creatinine over the next week.  Dyslipidemia: Continue statin  Pre-diabetes: A1c 6.3-have encouraged her diet/exercise-suggest repeat A1c in the next 3-6 months-if any elevated-consider adding metformin.  History of tobacco use: Although reportedly quit a year ago-his wife thinks he still smokes. He does continue to use chewed tobacco. He was counseled extensively this morning-have encouraged him not to use any of these agents-as they could trigger bronchospasm.  History of gout: No evidence of flare-continue allopurinol.  Morbid obesity: Encouraged weight loss. Defer to PCP-will need outpatient polysomnography.  Procedures/Studies: [facility] 4/20:   Normal coronary arteries.  Normal systolic left ventricular function with elevated end-diastolic pressure, consistent with diastolic heart failure.  Discharge Diagnoses:  Principal Problem:   Elevated troponin Active Problems:   HTN (hypertension)   Obesity   Smoker   Hypertriglyceridemia   Prediabetes   Paroxysmal atrial fibrillation (HCC)   Diastolic dysfunction   Asthma exacerbation   Atrial fibrillation with RVR [facility])  Discharge Instructions:  Activity:    As tolerated with Full fall precautions use walker/cane & assistance as needed  Discharge Instructions    Call MD for:  difficulty  breathing, headache or visual disturbances    Complete by:  As directed    Call MD for:  persistant dizziness or light-headedness    Complete by:  As directed    Diet - low sodium heart healthy    Complete by:  As directed    Discharge instructions    Complete by:  As directed    Follow with Primary MD  Clementeen Graham, MD  and Dr Jens Som as scheduled  Please get a complete blood count and chemistry panel checked by your Primary MD at your next visit, and again as instructed by your Primary MD.  Get Medicines reviewed and adjusted: Please take all your medications with you for your next visit with your Primary MD  Laboratory/radiological data: Please request your Primary MD to go over all hospital tests and procedure/radiological results at the follow up, please ask your Primary MD to get all Hospital records sent to his/her office.  In some cases, they will be blood work, cultures and biopsy results pending at the time of your discharge. Please request that your primary care M.D. follows up on these results.  Also Note the following: If you experience worsening of your admission symptoms, develop shortness of breath, life threatening emergency, suicidal or homicidal thoughts you must seek medical attention immediately by calling 911 or calling your MD immediately  if symptoms less severe.  You must read complete instructions/literature along with all the possible adverse reactions/side effects for all the Medicines you take and that have been prescribed to you. Take any new Medicines after you have completely understood and accpet all the possible adverse reactions/side effects.   Do not drive when taking Pain medications or sleeping medications (Benzodaizepines)  Do not take more than prescribed Pain, Sleep and Anxiety Medications. It is not advisable to  combine anxiety,sleep and pain medications without talking with your primary care practitioner  Special Instructions: If you have smoked or chewed Tobacco  in the last 2 yrs please stop smoking, stop any regular Alcohol  and or any Recreational drug use.  Wear Seat belts while driving.  Please note: You were cared for by a hospitalist during your hospital stay. Once you are discharged, your primary care physician will handle any further medical issues. Please note that NO REFILLS for any discharge medications will be authorized once you are discharged, as it is imperative that you return to your primary care physician (or establish a relationship with a primary care physician if you do not have one) for your post hospital discharge needs so that they can reassess your need for medications and monitor your lab values.   Increase activity slowly    Complete by:  As directed      Allergies as of 09/08/2016   No Known Allergies     Medication List    STOP taking these medications   triamcinolone cream 0.5 % Commonly known as:  KENALOG     TAKE these medications   albuterol 108 (90 Base) MCG/ACT inhaler Commonly known as:  PROVENTIL HFA;VENTOLIN HFA Inhale 2 puffs into the lungs every 6 (six) hours as needed for wheezing or shortness of breath.   allopurinol 100 MG tablet Commonly known as:  ZYLOPRIM Take 1 tablet (100 mg total) by mouth daily.   atorvastatin 40 MG tablet Commonly known as:  LIPITOR Take 1 tablet (40 mg total) by mouth daily at 6 PM. NEED FOLLOW UP APPOINTMENT FOR MORE REFILLS   budesonide-formoterol 160-4.5 MCG/ACT inhaler Commonly known  as:  SYMBICORT Inhale 2 puffs into the lungs 2 (two) times daily.   diltiazem 180 MG 24 hr capsule Commonly known as:  CARDIZEM CD Take 1 capsule (180 mg total) by mouth daily.   metoprolol 100 MG tablet Commonly known as:  LOPRESSOR Take 1 tablet (100 mg total) by mouth 2 (two) times daily.   rivaroxaban 20 MG Tabs  tablet Commonly known as:  XARELTO Take 1 tablet (20 mg total) by mouth daily with supper.      Follow-up Information    Olga Millers, MD Follow up on 09/13/2016.   Specialty:  Cardiology Why:  at 8:15 AM  to follow up for rapid heart rate. Contact information: [address] [place] [address] [phone]        Clementeen Graham, MD. Schedule an appointment as soon as possible for a visit in 1 week(s).   Specialties:  Family Medicine, Emergency Medicine Contact information: [phone] [place] [address] [phone]          No Known Allergies  Consultations:   cardiology  Other Procedures/Studies: Dg Chest 2 View  Result Date: 08/29/2016 CLINICAL DATA:  Left side chest pain EXAM: CHEST  2 VIEW COMPARISON:  09/08/2015 FINDINGS: Heart and mediastinal contours are within normal limits. No focal opacities or effusions. No acute bony abnormality. IMPRESSION: No active cardiopulmonary disease. Electronically Signed   By: Charlett Nose M.D.   On: 08/29/2016 07:52   Nm Myocar Multi W/spect W/wall Motion / Ef  Result Date: 09/06/2016  There was no ST segment deviation noted during stress.  The study is normal.  This is a low risk study.  The left ventricular ejection fraction is normal (55-65%).    Dg Chest Portable 1 View  Result Date: 09/06/2016 CLINICAL DATA:  Worsening shortness of breath since yesterday. Ran out of inhaler. Nonproductive cough. EXAM: PORTABLE CHEST 1 VIEW COMPARISON:  08/28/2016 FINDINGS: Low lung volumes. Normal heart size and pulmonary vascularity. No focal airspace disease or consolidation in the lungs. No blunting of costophrenic angles. No pneumothorax. Mediastinal contours appear intact. Degenerative changes in the spine and shoulders. IMPRESSION: No active disease. Electronically Signed   By: Burman Nieves M.D.   On: 09/06/2016 01:17      TODAY-DAY OF DISCHARGE:  Subjective:   Billy Beard today  has no headache,no chest abdominal pain,no new weakness tingling or numbness, feels much better wants to go home today  Objective:   Blood pressure 120/81, pulse (!) 45, temperature 97.8 F (36.6 C), temperature source Oral, resp. rate 15, height  (1.6 m), weight 87.8 kg (193 lb 9.6 oz), SpO2 96 %.  Intake/Output Summary (Last 24 hours) at 09/08/16 1020 Last data filed at 09/08/16 0900  Gross per 24 hour  Intake          1238.78 ml  Output             1200 ml  Net            38.78 ml   Filed Weights   09/06/16 0558 09/07/16 0540 09/08/16 0545  Weight: 86 kg (189 lb 8 oz) 86.4 kg (190 lb 6.4 oz) 87.8 kg (193 lb 9.6 oz)    Exam: Awake Alert, Oriented *3, No new F.N deficits, Normal affect [place].AT,PERRAL Supple Neck,No JVD, No cervical lymphadenopathy appriciated.  Symmetrical Chest wall movement, Good air movement bilaterally, CTAB RRR,No Gallops,Rubs or new Murmurs, No Parasternal Heave +ve B.Sounds, Abd Soft,  Non tender, No organomegaly appriciated, No rebound -guarding or rigidity. No Cyanosis, Clubbing or edema, No new Rash or bruise   PERTINENT RADIOLOGIC STUDIES: Dg Chest 2 View  Result Date: 08/29/2016 CLINICAL DATA:  Left side chest pain EXAM: CHEST  2 VIEW COMPARISON:  09/08/2015 FINDINGS: Heart and mediastinal contours are within normal limits. No focal opacities or effusions. No acute bony abnormality. IMPRESSION: No active cardiopulmonary disease. Electronically Signed   By: Charlett Nose M.D.   On: 08/29/2016 07:52   Nm Myocar Multi W/spect W/wall Motion / Ef  Result Date: 09/06/2016  There was no ST segment deviation noted during stress.  The study is normal.  This is a low risk study.  The left ventricular ejection fraction is normal (55-65%).    Dg Chest Portable 1 View  Result Date: 09/06/2016 CLINICAL DATA:  Worsening shortness of breath since yesterday. Ran out of inhaler. Nonproductive cough. EXAM: PORTABLE CHEST 1 VIEW COMPARISON:  08/28/2016 FINDINGS:  Low lung volumes. Normal heart size and pulmonary vascularity. No focal airspace disease or consolidation in the lungs. No blunting of costophrenic angles. No pneumothorax. Mediastinal contours appear intact. Degenerative changes in the spine and shoulders. IMPRESSION: No active disease. Electronically Signed   By: Burman Nieves M.D.   On: 09/06/2016 01:17     PERTINENT LAB RESULTS: CBC:  Recent Labs  09/07/16 0310 09/08/16 0307  WBC 15.3* 16.3*  HGB 14.8 13.6  HCT 44.8 42.8  PLT 222 229   CMET CMP     Component Value Date/Time   NA 135 09/08/2016 0307   K 4.3 09/08/2016 0307   CL 103 09/08/2016 0307   CO2 20 (L) 09/08/2016 0307   GLUCOSE 323 (H) 09/08/2016 0307   BUN 31 (H) 09/08/2016 0307   CREATININE 1.27 (H) 09/08/2016 0307   CREATININE 0.86 08/20/2015 1556   CALCIUM 9.0 09/08/2016 0307   PROT 7.0 09/06/2016 0008   ALBUMIN 3.8 09/06/2016 0008   AST 71 (H) 09/06/2016 0008   ALT 107 (H) 09/06/2016 0008   ALKPHOS 99 09/06/2016 0008   BILITOT 0.5 09/06/2016 0008   GFRNONAA >60 09/08/2016 0307   GFRNONAA >89 08/20/2015 1556   GFRAA >60 09/08/2016 0307   GFRAA >89 08/20/2015 1556    GFR Estimated Creatinine Clearance: 73.5 mL/min (A) (by C-G formula based on SCr of 1.27 mg/dL (H)). No results for input(s): LIPASE, AMYLASE in the last 72 hours.  Recent Labs  09/06/16 2015 09/07/16 1034 09/07/16 1534  TROPONINI 0.32* 0.25* 0.22*   Invalid input(s): POCBNP  Recent Labs  09/06/16 0849  DDIMER 0.37    Recent Labs  09/06/16 0849  HGBA1C 6.3*    Recent Labs  09/07/16 0310  CHOL 228*  HDL 53  LDLCALC 140*  TRIG 173*  CHOLHDL 4.3    Recent Labs  09/06/16 0950  TSH 1.610   No results for input(s): VITAMINB12, FOLATE, FERRITIN, TIBC, IRON, RETICCTPCT in the last 72 hours. Coags:  Recent Labs  09/08/16 0307  INR 1.08   Microbiology: No results found for this or any previous visit (from the past 240 hour(s)).  FURTHER DISCHARGE  INSTRUCTIONS:  Get Medicines reviewed and adjusted: Please take all your medications with you for your next visit with your Primary MD  Laboratory/radiological data: Please request your Primary MD to go over all hospital tests and procedure/radiological results at the follow up, please ask your Primary MD to get all Hospital records sent to his/her office.  In some cases, they will  be blood work, cultures and biopsy results pending at the time of your discharge. Please request that your primary care M.D. goes through all the records of your hospital data and follows up on these results.  Also Note the following: If you experience worsening of your admission symptoms, develop shortness of breath, life threatening emergency, suicidal or homicidal thoughts you must seek medical attention immediately by calling 911 or calling your MD immediately  if symptoms less severe.  You must read complete instructions/literature along with all the possible adverse reactions/side effects for all the Medicines you take and that have been prescribed to you. Take any new Medicines after you have completely understood and accpet all the possible adverse reactions/side effects.   Do not drive when taking Pain medications or sleeping medications (Benzodaizepines)  Do not take more than prescribed Pain, Sleep and Anxiety Medications. It is not advisable to combine anxiety,sleep and pain medications without talking with your primary care practitioner  Special Instructions: If you have smoked or chewed Tobacco  in the last 2 yrs please stop smoking, stop any regular Alcohol  and or any Recreational drug use.  Wear Seat belts while driving.  Please note: You were cared for by a hospitalist during your hospital stay. Once you are discharged, your primary care physician will handle any further medical issues. Please note that NO REFILLS for any discharge medications will be authorized once you are discharged, as it is  imperative that you return to your primary care physician (or establish a relationship with a primary care physician if you do not have one) for your post hospital discharge needs so that they can reassess your need for medications and monitor your lab values.  Total Time spent coordinating discharge including counseling, education and face to face time equals  45 minutes.  Signed: Shanker Ghimire 09/08/2016 10:20 AM

## 2016-09-08 NOTE — Interval H&P Note (Signed)
Cath Lab Visit (complete for each Cath Lab visit)  Clinical Evaluation Leading to the Procedure:   ACS: Yes.    Non-ACS:    Anginal Classification: CCS III  Anti-ischemic medical therapy: Minimal Therapy (1 class of medications)  Non-Invasive Test Results: Low-risk stress test findings: cardiac mortality <1%/year  Prior CABG: No previous CABG      History and Physical Interval Note:  09/08/2016 7:21 AM  Billy Beard  has presented today for surgery, with the diagnosis of pas - abnormal ekg - n stemi  The various methods of treatment have been discussed with the patient and family. After consideration of risks, benefits and other options for treatment, the patient has consented to  Procedure(s): Left Heart Cath and Coronary Angiography (N/A) as a surgical intervention .  The patient's history has been reviewed, patient examined, no change in status, stable for surgery.  I have reviewed the patient's chart and labs.  Questions were answered to the patient's satisfaction.     Lyn Records III

## 2016-09-13 ENCOUNTER — Ambulatory Visit (INDEPENDENT_AMBULATORY_CARE_PROVIDER_SITE_OTHER): Payer: BLUE CROSS/BLUE SHIELD | Admitting: Cardiology

## 2016-09-13 ENCOUNTER — Encounter: Payer: Self-pay | Admitting: Cardiology

## 2016-09-13 ENCOUNTER — Ambulatory Visit: Payer: BLUE CROSS/BLUE SHIELD | Admitting: Cardiology

## 2016-09-13 ENCOUNTER — Encounter: Payer: Self-pay | Admitting: *Deleted

## 2016-09-13 VITALS — BP 125/75 | HR 45 | Ht 63.0 in | Wt 188.0 lb

## 2016-09-13 DIAGNOSIS — I480 Paroxysmal atrial fibrillation: Secondary | ICD-10-CM | POA: Diagnosis not present

## 2016-09-13 DIAGNOSIS — I10 Essential (primary) hypertension: Secondary | ICD-10-CM

## 2016-09-13 DIAGNOSIS — E785 Hyperlipidemia, unspecified: Secondary | ICD-10-CM

## 2016-09-13 DIAGNOSIS — R0789 Other chest pain: Secondary | ICD-10-CM

## 2016-09-13 MED ORDER — METOPROLOL TARTRATE 75 MG PO TABS: 75.0000 mg | ORAL_TABLET | Freq: Two times a day (BID) | ORAL | 3 refills | Status: DC

## 2016-09-13 NOTE — Patient Instructions (Signed)
Medication Instructions:   DECREASE METOPROLOL TO 75 MG TWICE DAILY  Labwork:  Your physician recommends that you HAVE LAB WORK TODAY  Your physician recommends that you return for lab work in: 6 WEEKS= DO NOT EAT PRIOR TO LAB WORK  Follow-Up:  Your physician recommends that you schedule a follow-up appointment in: 3 MONTHS WITH DR Jens Som IN [place]   If you need a refill on your cardiac medications before your next appointment, please call your pharmacy.

## 2016-09-14 LAB — BASIC METABOLIC PANEL
BUN/Creatinine Ratio: 20 (ref 9–20)
BUN: 19 mg/dL (ref 6–24)
CO2: 21 mmol/L (ref 18–29)
Calcium: 9.1 mg/dL (ref 8.7–10.2)
Chloride: 100 mmol/L (ref 96–106)
Creatinine, Ser: 0.96 mg/dL (ref 0.76–1.27)
GFR, EST AFRICAN AMERICAN: 111 mL/min/{1.73_m2} (ref >59)
GFR, EST NON AFRICAN AMERICAN: 96 mL/min/{1.73_m2} (ref >59)
Glucose: 158 mg/dL — ABNORMAL HIGH (ref 65–99)
POTASSIUM: 4.4 mmol/L (ref 3.5–5.2)
SODIUM: 137 mmol/L (ref 134–144)

## 2016-09-28 ENCOUNTER — Encounter: Payer: Self-pay | Admitting: *Deleted

## 2016-10-03 ENCOUNTER — Encounter: Payer: Self-pay | Admitting: Family Medicine

## 2016-10-03 ENCOUNTER — Ambulatory Visit (INDEPENDENT_AMBULATORY_CARE_PROVIDER_SITE_OTHER): Payer: BLUE CROSS/BLUE SHIELD | Admitting: Family Medicine

## 2016-10-03 VITALS — BP 135/74 | HR 49 | Ht 63.0 in | Wt 186.3 lb

## 2016-10-03 DIAGNOSIS — I10 Essential (primary) hypertension: Secondary | ICD-10-CM | POA: Diagnosis not present

## 2016-10-03 DIAGNOSIS — I480 Paroxysmal atrial fibrillation: Secondary | ICD-10-CM | POA: Diagnosis not present

## 2016-10-03 DIAGNOSIS — R0789 Other chest pain: Secondary | ICD-10-CM

## 2016-10-03 MED ORDER — ATORVASTATIN CALCIUM 40 MG PO TABS: 40.0000 mg | ORAL_TABLET | Freq: Every day | ORAL | 3 refills | Status: AC

## 2016-10-03 MED ORDER — DILTIAZEM HCL ER COATED BEADS 180 MG PO CP24: 180.0000 mg | ORAL_CAPSULE | Freq: Every day | ORAL | 0 refills | Status: DC

## 2016-10-03 MED ORDER — RIVAROXABAN 20 MG PO TABS: 20.0000 mg | ORAL_TABLET | Freq: Every day | ORAL | 3 refills | Status: DC

## 2016-10-03 MED ORDER — METOPROLOL TARTRATE 75 MG PO TABS: 75.0000 mg | ORAL_TABLET | Freq: Two times a day (BID) | ORAL | 3 refills | Status: DC

## 2016-10-03 NOTE — Progress Notes (Signed)
Billy Beard is a 46 y.o. male who [facility] today for atrial fibrillation. Patient was seen recently in the hospital and by his cardiologist for atrial fibrillation. He was prescribed diltiazem and Toprol and Xarelto. He was seen by cardiology in April and asked to decrease his metoprolol from 100 mg twice daily to 75 mg twice daily. He never did this. Additionally he never started the atorvastatin that was prescribed as well. He denies chest pain palpitations shortness of breath lightheadedness or dizziness. He feels reasonably well otherwise. He has a follow-up appointment with cardiology soon in the near future.   Past Medical History:  Diagnosis Date  . Asthma   . Atrial fibrillation (HCC)   . Gout   . Hypertension    Past Surgical History:  Procedure Laterality Date  . LEFT HEART CATH AND CORONARY ANGIOGRAPHY N/A 09/08/2016   Procedure: Left Heart Cath and Coronary Angiography;  Surgeon: Lyn RecordsHenry W Smith, MD;  [facility];  Service: Cardiovascular;  Laterality: N/A;  . THYROIDECTOMY  08/2015   Patient unsure how much was removed    Social History  Substance Use Topics  . Smoking status: Former Smoker    Packs/day: 0.25    Years: 3.00    Types: Cigarettes  . Smokeless tobacco: Current User    Types: Chew  . Alcohol use Yes     Comment: Occasionally per patient, 550cc per patient a week   family history includes Gout in his father; Heart disease in his mother.  ROS as above:  Medications: Current Outpatient Prescriptions  Medication Sig Dispense Refill  . albuterol (PROVENTIL HFA;VENTOLIN HFA) 108 (90 Base) MCG/ACT inhaler Inhale 2 puffs into the lungs every 6 (six) hours as needed for wheezing or shortness of breath. 1 Inhaler 0  . allopurinol (ZYLOPRIM) 100 MG tablet Take 1 tablet (100 mg total) by mouth daily. 30 tablet 6    . budesonide-formoterol (SYMBICORT) 160-4.5 MCG/ACT inhaler Inhale 2 puffs into the lungs 2 (two) times daily. 1 Inhaler 0  . diltiazem (CARDIZEM CD) 180 MG 24 hr capsule Take 1 capsule (180 mg total) by mouth daily. 90 capsule 0  . atorvastatin (LIPITOR) 40 MG tablet Take 1 tablet (40 mg total) by mouth daily. 90 tablet 3  . Metoprolol Tartrate 75 MG TABS Take 75 mg by mouth 2 (two) times daily. 180 tablet 3  . rivaroxaban (XARELTO) 20 MG TABS tablet Take 1 tablet (20 mg total) by mouth daily with supper. 90 tablet 3   No current facility-administered medications for this visit.    No Known Allergies  Health Maintenance Health Maintenance  Topic Date Due  . INFLUENZA VACCINE  12/20/2016  . TETANUS/TDAP  05/22/2017  . HIV Screening  Completed     Exam:  BP 135/74   Pulse (!) 49   Ht 5\' 3"  (1.6 m)   Wt 186 lb 4.8 oz (84.5 kg)   SpO2 98%   BMI 33.00 kg/m  Gen: Well NAD HEENT: EOMI,  MMM Lungs: Normal work of breathing. CTABL Heart: Bradycardia but regular no MRG Abd: NABS, Soft. Nondistended, Nontender Exts: Brisk capillary refill, warm and well perfused.  No edema   No results found for this or any previous visit (from the past 72 hour(s)). No results found.    Assessment and Plan: 46 y.o. male with atrial fibrillation. Patient is a bit bradycardic which I suspect is due to double  rate control with diltiazem and metoprolol. Agree with cardiology to reduce metoprolol dose to 75 mg twice daily. I have written this prescription along with a refill of the diltiazem Xarelto and Lipitor. Recheck in 2 months.   No orders of the defined types were placed in this encounter.  Meds ordered this encounter  Medications  . DISCONTD: metoprolol tartrate (LOPRESSOR) 100 MG tablet    Sig: Take 1 tablet by mouth 2 (two) times daily.    Refill:  0  . Metoprolol Tartrate 75 MG TABS    Sig: Take 75 mg by mouth 2 (two) times daily.    Dispense:  180 tablet    Refill:  3  . diltiazem  (CARDIZEM CD) 180 MG 24 hr capsule    Sig: Take 1 capsule (180 mg total) by mouth daily.    Dispense:  90 capsule    Refill:  0  . rivaroxaban (XARELTO) 20 MG TABS tablet    Sig: Take 1 tablet (20 mg total) by mouth daily with supper.    Dispense:  90 tablet    Refill:  3  . atorvastatin (LIPITOR) 40 MG tablet    Sig: Take 1 tablet (40 mg total) by mouth daily.    Dispense:  90 tablet    Refill:  3     Discussed warning signs or symptoms. Please see discharge instructions. Patient expresses understanding.

## 2016-10-03 NOTE — Patient Instructions (Addendum)
Thank you for coming in today. Continue the Diltiazem for heart rate control. Continue metoprolol 75mg  twice daily for heart rate and for blood pressure.  Continue Xeralto daily for blood thinning.  Continue Atorvastatin for cholesterol.   Recheck with me in 2 months.  You should have labs with Dr Jens Somrenshaw in June.   Use the Symbicort inhaler twice daily every day even if you feel good.   Call or go to the emergency room if you get worse, have trouble breathing, have chest pains, or palpitations.

## 2016-10-05 ENCOUNTER — Telehealth: Payer: Self-pay | Admitting: *Deleted

## 2016-10-05 NOTE — Telephone Encounter (Signed)
PA for metoprolol 75 mg approved through 05/21/2038.

## 2016-10-09 ENCOUNTER — Telehealth: Payer: Self-pay | Admitting: Family Medicine

## 2016-10-09 NOTE — Telephone Encounter (Signed)
Received fax for PA on Metoprolol sent through cover my meds waiting on determination. - CF

## 2016-10-24 NOTE — Telephone Encounter (Signed)
I checked PA on cover my meds and it originally stated it was denied but on another claim it says it is approved from 09/27/16 - 05/21/2038. I called pharmacy and they said a paid claim for 90 days was picked up on 10/09/16. - CF

## 2016-11-07 LAB — HEPATIC FUNCTION PANEL
ALBUMIN: 4 g/dL (ref 3.5–5.5)
ALK PHOS: 112 IU/L (ref 39–117)
ALT: 44 IU/L (ref 0–44)
AST: 28 IU/L (ref 0–40)
BILIRUBIN TOTAL: 0.4 mg/dL (ref 0.0–1.2)
BILIRUBIN, DIRECT: 0.14 mg/dL (ref 0.00–0.40)
TOTAL PROTEIN: 6.9 g/dL (ref 6.0–8.5)

## 2016-11-07 LAB — LIPID PANEL
Chol/HDL Ratio: 3.3 ratio (ref 0.0–5.0)
Cholesterol, Total: 119 mg/dL (ref 100–199)
HDL: 36 mg/dL — AB (ref >39)
LDL Calculated: 14 mg/dL (ref 0–99)
Triglycerides: 344 mg/dL — ABNORMAL HIGH (ref 0–149)
VLDL Cholesterol Cal: 69 mg/dL — ABNORMAL HIGH (ref 5–40)

## 2016-11-08 ENCOUNTER — Encounter: Payer: Self-pay | Admitting: *Deleted

## 2016-11-23 NOTE — Progress Notes (Signed)
HPI: FU PAF Echocardiogram April 2018 showed vigorous LV systolic function, mild left ventricular hypertrophy and grade 2 diastolic dysfunction. Mild left atrial enlargement. Cardiac catheterization April 2018 showed normal coronary arteries and normal LV systolic function. Left ventricular end-diastolic pressure elevated at 24. TSH April 2018 1.61. Patient admitted April 2018 with chest pain and troponin minimally increased. He had a normal nuclear study but continued to have symptoms and therefore underwent cardiac catheterization as outlined above. He was noted to have intermittent atrial fibrillation and was anticoagulated and metoprolol/Cardizem added. Plan was to add flecanide if he had recurrent PAF. Since last seen, the patient denies any dyspnea on exertion, orthopnea, PND, pedal edema, palpitations, syncope or chest pain.   Current Outpatient Prescriptions  Medication Sig Dispense Refill  . albuterol (PROVENTIL HFA;VENTOLIN HFA) 108 (90 Base) MCG/ACT inhaler Inhale 2 puffs into the lungs every 6 (six) hours as needed for wheezing or shortness of breath. 1 Inhaler 0  . allopurinol (ZYLOPRIM) 100 MG tablet Take 1 tablet (100 mg total) by mouth daily. 30 tablet 6  . atorvastatin (LIPITOR) 40 MG tablet Take 1 tablet (40 mg total) by mouth daily. 90 tablet 3  . budesonide-formoterol (SYMBICORT) 160-4.5 MCG/ACT inhaler Inhale 2 puffs into the lungs 2 (two) times daily. 1 Inhaler 0  . diltiazem (CARDIZEM CD) 180 MG 24 hr capsule Take 1 capsule (180 mg total) by mouth daily. 90 capsule 0  . Metoprolol Tartrate 75 MG TABS Take 75 mg by mouth 2 (two) times daily. 180 tablet 3  . rivaroxaban (XARELTO) 20 MG TABS tablet Take 1 tablet (20 mg total) by mouth daily with supper. 90 tablet 3   No current facility-administered medications for this visit.      Past Medical History:  Diagnosis Date  . Asthma   . Atrial fibrillation (HCC)   . Gout   . Hypertension     Past Surgical History:    Procedure Laterality Date  . LEFT HEART CATH AND CORONARY ANGIOGRAPHY N/A 09/08/2016   Procedure: Left Heart Cath and Coronary Angiography;  Surgeon: Lyn RecordsHenry W Smith, MD;  [facility];  Service: Cardiovascular;  Laterality: N/A;  . THYROIDECTOMY  08/2015   Patient unsure how much was removed     Social History   Social History  . Marital status: Married    Spouse name: N/A  . Number of children: N/A  . Years of education: N/A   Occupational History  . Not on file.   Social History Main Topics  . Smoking status: Former Smoker    Packs/day: 0.25    Years: 3.00    Types: Cigarettes  . Smokeless tobacco: Current User    Types: Chew  . Alcohol use Yes     Comment: Occasionally per patient, 550cc per patient a week  . Drug use: No  . Sexual activity: Not on file   Other Topics Concern  . Not on file   Social History Narrative  . No narrative on file    Family History  Problem Relation Age of Onset  . Heart disease Mother   . Gout Father     ROS: no fevers or chills, productive cough, hemoptysis, dysphasia, odynophagia, melena, hematochezia, dysuria, hematuria, rash, seizure activity, orthopnea, PND, pedal edema, claudication. Remaining systems are negative.  Physical Exam: Well-developed well-nourished in no acute distress.  Skin is warm and dry.  HEENT is normal.  Neck is supple. No bruits  Chest is clear to  auscultation with normal expansion.  Cardiovascular exam is regular rate and rhythm.  Abdominal exam nontender or distended. No masses palpated. Extremities show no edema. neuro grossly intact   A/P  1 Paroxysmal atrial fibrillation-patient remains in sinus rhythm today. We will continue present dose of Cardizem. His heart rate is mildly decreased. Change metoprolol to 50 mg twice a day. Continue xarelto. If he has more frequent episodes in the future we will consider antiarrhythmic. Note he had significant alcohol use previously and he has  decreased this. I have explained that this can contribute to atrial fibrillation.   2 chest pain-no recurrent symptoms. Previous catheterization revealed no obstructive coronary disease.  3 hypertension-blood pressure is controlled. Continue present medications.  4 hyperlipidemia-continue statin.  5 chronic diastolic congestive heart failure-patient is euvolemic on examination. Continue low-sodium diet. He is not on diuretic at present.   Billy Millers, MD

## 2016-12-06 ENCOUNTER — Ambulatory Visit (INDEPENDENT_AMBULATORY_CARE_PROVIDER_SITE_OTHER): Payer: BLUE CROSS/BLUE SHIELD | Admitting: Cardiology

## 2016-12-06 ENCOUNTER — Encounter: Payer: Self-pay | Admitting: Cardiology

## 2016-12-06 VITALS — BP 130/83 | HR 45 | Ht 63.0 in | Wt 183.0 lb

## 2016-12-06 DIAGNOSIS — R0789 Other chest pain: Secondary | ICD-10-CM

## 2016-12-06 DIAGNOSIS — I10 Essential (primary) hypertension: Secondary | ICD-10-CM | POA: Diagnosis not present

## 2016-12-06 DIAGNOSIS — I480 Paroxysmal atrial fibrillation: Secondary | ICD-10-CM

## 2016-12-06 DIAGNOSIS — R001 Bradycardia, unspecified: Secondary | ICD-10-CM | POA: Diagnosis not present

## 2016-12-06 MED ORDER — METOPROLOL TARTRATE 50 MG PO TABS: 25.0000 mg | ORAL_TABLET | Freq: Two times a day (BID) | ORAL | 3 refills | Status: DC

## 2016-12-06 NOTE — Patient Instructions (Addendum)
DECREASE METOPROLOL TO 50 MG TWICE DAILY   Your physician wants you to follow-up in: 6 MONTHS WITH DR  Jens SomRENSHAW You will receive a reminder letter in the mail two months in advance. If you don't receive a letter, please call our office to schedule the follow-up appointment.   If you need a refill on your cardiac medications before your next appointment, please call your pharmacy.

## 2017-01-12 ENCOUNTER — Other Ambulatory Visit: Payer: Self-pay

## 2017-01-12 DIAGNOSIS — R0789 Other chest pain: Secondary | ICD-10-CM

## 2017-01-16 ENCOUNTER — Ambulatory Visit (INDEPENDENT_AMBULATORY_CARE_PROVIDER_SITE_OTHER): Payer: BLUE CROSS/BLUE SHIELD | Admitting: Family Medicine

## 2017-01-16 ENCOUNTER — Encounter (HOSPITAL_COMMUNITY): Payer: Self-pay | Admitting: Emergency Medicine

## 2017-01-16 ENCOUNTER — Emergency Department (HOSPITAL_COMMUNITY): Payer: BLUE CROSS/BLUE SHIELD

## 2017-01-16 ENCOUNTER — Emergency Department (HOSPITAL_COMMUNITY)
Admission: EM | Admit: 2017-01-16 | Discharge: 2017-01-17 | Disposition: A | Discharge status: Other Acute Inpatient Hospital | Payer: BLUE CROSS/BLUE SHIELD | Attending: Emergency Medicine | Admitting: Emergency Medicine

## 2017-01-16 VITALS — BP 194/109 | HR 64 | Temp 98.2°F | Wt 181.0 lb

## 2017-01-16 DIAGNOSIS — R51 Headache: Secondary | ICD-10-CM | POA: Insufficient documentation

## 2017-01-16 DIAGNOSIS — F322 Major depressive disorder, single episode, severe without psychotic features: Secondary | ICD-10-CM | POA: Diagnosis not present

## 2017-01-16 DIAGNOSIS — F321 Major depressive disorder, single episode, moderate: Secondary | ICD-10-CM | POA: Diagnosis not present

## 2017-01-16 DIAGNOSIS — Z7901 Long term (current) use of anticoagulants: Secondary | ICD-10-CM | POA: Diagnosis not present

## 2017-01-16 DIAGNOSIS — M1A9XX0 Chronic gout, unspecified, without tophus (tophi): Secondary | ICD-10-CM | POA: Diagnosis not present

## 2017-01-16 DIAGNOSIS — Z87891 Personal history of nicotine dependence: Secondary | ICD-10-CM | POA: Insufficient documentation

## 2017-01-16 DIAGNOSIS — Z79899 Other long term (current) drug therapy: Secondary | ICD-10-CM | POA: Insufficient documentation

## 2017-01-16 DIAGNOSIS — R45851 Suicidal ideations: Secondary | ICD-10-CM | POA: Insufficient documentation

## 2017-01-16 DIAGNOSIS — J45909 Unspecified asthma, uncomplicated: Secondary | ICD-10-CM | POA: Diagnosis not present

## 2017-01-16 DIAGNOSIS — F329 Major depressive disorder, single episode, unspecified: Secondary | ICD-10-CM | POA: Diagnosis present

## 2017-01-16 DIAGNOSIS — I10 Essential (primary) hypertension: Secondary | ICD-10-CM

## 2017-01-16 DIAGNOSIS — R519 Headache, unspecified: Secondary | ICD-10-CM

## 2017-01-16 LAB — CBC
HEMATOCRIT: 45.4 % (ref 39.0–52.0)
HEMOGLOBIN: 15.4 g/dL (ref 13.0–17.0)
MCH: 27.2 pg (ref 26.0–34.0)
MCHC: 33.9 g/dL (ref 30.0–36.0)
MCV: 80.1 fL (ref 78.0–100.0)
Platelets: 213 10*3/uL (ref 150–400)
RBC: 5.67 MIL/uL (ref 4.22–5.81)
RDW: 12.6 % (ref 11.5–15.5)
WBC: 7.7 10*3/uL (ref 4.0–10.5)

## 2017-01-16 LAB — COMPREHENSIVE METABOLIC PANEL
ALBUMIN: 3.8 g/dL (ref 3.5–5.0)
ALK PHOS: 87 U/L (ref 38–126)
ALT: 40 U/L (ref 17–63)
ANION GAP: 10 (ref 5–15)
AST: 31 U/L (ref 15–41)
BUN: 13 mg/dL (ref 6–20)
CALCIUM: 10.5 mg/dL — AB (ref 8.9–10.3)
CO2: 22 mmol/L (ref 22–32)
Chloride: 102 mmol/L (ref 101–111)
Creatinine, Ser: 1.09 mg/dL (ref 0.61–1.24)
GFR calc Af Amer: >60 mL/min (ref >60)
GFR calc non Af Amer: >60 mL/min (ref >60)
Glucose, Bld: 100 mg/dL — ABNORMAL HIGH (ref 65–99)
POTASSIUM: 3.7 mmol/L (ref 3.5–5.1)
SODIUM: 134 mmol/L — AB (ref 135–145)
Total Bilirubin: 0.5 mg/dL (ref 0.3–1.2)
Total Protein: 7.1 g/dL (ref 6.5–8.1)

## 2017-01-16 LAB — RAPID URINE DRUG SCREEN, HOSP PERFORMED
Amphetamines: NOT DETECTED
BARBITURATES: NOT DETECTED
BENZODIAZEPINES: NOT DETECTED
COCAINE: NOT DETECTED
Opiates: NOT DETECTED
TETRAHYDROCANNABINOL: NOT DETECTED

## 2017-01-16 LAB — ETHANOL: Alcohol, Ethyl (B): <5 mg/dL (ref <5)

## 2017-01-16 LAB — SALICYLATE LEVEL: Salicylate Lvl: <7 mg/dL (ref 2.8–30.0)

## 2017-01-16 LAB — ACETAMINOPHEN LEVEL

## 2017-01-16 MED ORDER — ZOLPIDEM TARTRATE 5 MG PO TABS: 5.0000 mg | ORAL_TABLET | Freq: Every evening | ORAL | Status: DC | PRN

## 2017-01-16 MED ORDER — ALLOPURINOL 100 MG PO TABS
100.0000 mg | ORAL_TABLET | Freq: Every day | ORAL | Status: DC
  Administered 2017-01-16: 100 mg via ORAL
  Filled 2017-01-16: qty 1

## 2017-01-16 MED ORDER — ONDANSETRON HCL 4 MG PO TABS: 4.0000 mg | ORAL_TABLET | Freq: Three times a day (TID) | ORAL | Status: DC | PRN

## 2017-01-16 MED ORDER — DILTIAZEM HCL ER COATED BEADS 180 MG PO CP24
180.0000 mg | ORAL_CAPSULE | Freq: Every day | ORAL | Status: DC
  Administered 2017-01-16: 180 mg via ORAL
  Filled 2017-01-16: qty 1

## 2017-01-16 MED ORDER — METOPROLOL TARTRATE 25 MG PO TABS
25.0000 mg | ORAL_TABLET | Freq: Two times a day (BID) | ORAL | Status: DC
  Administered 2017-01-16: 25 mg via ORAL
  Filled 2017-01-16: qty 1

## 2017-01-16 MED ORDER — ACETAMINOPHEN 325 MG PO TABS: 650.0000 mg | ORAL_TABLET | ORAL | Status: DC | PRN

## 2017-01-16 MED ORDER — ATORVASTATIN CALCIUM 40 MG PO TABS
40.0000 mg | ORAL_TABLET | Freq: Every day | ORAL | Status: DC
  Administered 2017-01-16: 40 mg via ORAL
  Filled 2017-01-16: qty 1

## 2017-01-16 MED ORDER — ALUM & MAG HYDROXIDE-SIMETH 200-200-20 MG/5ML PO SUSP: 30.0000 mL | Freq: Four times a day (QID) | ORAL | Status: DC | PRN

## 2017-01-16 MED ORDER — ACETAMINOPHEN 325 MG PO TABS
650.0000 mg | ORAL_TABLET | Freq: Once | ORAL | Status: AC
  Administered 2017-01-16: 650 mg via ORAL
  Filled 2017-01-16: qty 2

## 2017-01-16 MED ORDER — ALLOPURINOL 100 MG PO TABS: 100.0000 mg | ORAL_TABLET | Freq: Every day | ORAL | 3 refills | Status: DC

## 2017-01-16 MED ORDER — RIVAROXABAN 20 MG PO TABS
20.0000 mg | ORAL_TABLET | Freq: Every day | ORAL | Status: DC
  Administered 2017-01-16: 20 mg via ORAL
  Filled 2017-01-16: qty 1

## 2017-01-16 NOTE — ED Provider Notes (Signed)
[facility]-EMERGENCY DEPT Provider Note   CSN: [id number] Arrival date & time: 01/16/17  1743     History   Chief Complaint Chief Complaint  Patient presents with  . Suicidal    HPI Billy Beard is a 46 y.o. male with a PMHx of HTN, gout, Afib on xarelto, asthma, depression, HLD, prediabetes, obesity, tobacco use, and diastolic dysfunction, who presents to the ED with complaints of suicidal ideations with a plan to hang himself. Patient states that he has had a lot of stress in his marriage lately, he states that his wife has been telling him to get remarried and has been very critical of him, putting him down and telling him to leave the marriage. He states that yesterday he went to [place] to say goodbye to his son, with every intention of hanging himself last night. He states that right before he on himself, he thought about his PCP whom he really likes, so he decided to not proceed with his suicide attempt and instead make an appointment to see his PCP one last time. Chart review reveals he was seen by his PCP just PTA and reported 2wks of SI with a plan to hang himself yesterday however he decided to get help instead; per their notes, he was sent here for further management of his SI, but scheduled f/up visit with them in 2 days for ongoing management of his HTN as well as his depression. He also complains of a headache 3 days which has been intermittent with the stress in his life, states that the more he thinks about the stressors, the more his head hurts. He describes his headache as 8/10 intermittent frontal throbbing nonradiating pain that worsens with stress, and he has not tried anything for it, no known alleviating factors. He endorses compliance with his medications, took his Metoprolol 25mg  this morning at 5:30am, and he hasn't taken his nighttime dose; hasn't taken diltiazem yet because he usually takes it at night, around 9pm. He is not on any antidepressants. He denies HI, AVH,  illicit drug use, or tobacco use. Reports occasional EtOH use, mostly on the weekends, but hasn't had any alcohol in 2wks. He denies vision changes, lightheadedness, syncope, fevers, chills, CP, SOB, LE swelling, abd pain, N/V/D/C, hematuria, dysuria, myalgias, arthralgias, numbness, tingling, focal weakness, or any other complaints at this time. Here voluntarily.    The history is provided by the patient and medical records. No language interpreter was used.  Mental Health Problem  Presenting symptoms: depression and suicidal thoughts   Presenting symptoms: no hallucinations and no homicidal ideas   Onset quality:  Gradual Duration:  2 weeks Timing:  Constant Progression:  Worsening Chronicity:  New Context: stressful life event   Treatment compliance:  Untreated Relieved by:  None tried Worsened by:  Family interactions Ineffective treatments:  None tried Associated symptoms: headaches   Associated symptoms: no abdominal pain and no chest pain   Risk factors: no hx of mental illness, no hx of suicide attempts and no recent psychiatric admission     Past Medical History:  Diagnosis Date  . Asthma   . Atrial fibrillation (HCC)   . Gout   . Hypertension     Patient Active Problem List   Diagnosis Date Noted  . Depression, major, single episode, severe (HCC) 01/16/2017  . Elevated troponin 09/06/2016  . Diastolic dysfunction 08/31/2016  . Paroxysmal atrial fibrillation (HCC) 02/01/2016  . Hypertriglyceridemia 08/23/2015  . Prediabetes 08/23/2015  . Gout 08/06/2015  . HTN (  hypertension) 12/09/2014  . Obesity 12/09/2014  . Smoker 12/09/2014    Past Surgical History:  Procedure Laterality Date  . LEFT HEART CATH AND CORONARY ANGIOGRAPHY N/A 09/08/2016   Procedure: Left Heart Cath and Coronary Angiography;  Surgeon: Lyn Records, MD;  Location: [facility] INVASIVE CV LAB;  Service: Cardiovascular;  Laterality: N/A;  . THYROIDECTOMY  08/2015   Patient unsure how much was removed         Home Medications    Prior to Admission medications   Medication Sig Start Date End Date Taking? Authorizing Provider  albuterol (PROVENTIL HFA;VENTOLIN HFA) 108 (90 Base) MCG/ACT inhaler Inhale 2 puffs into the lungs every 6 (six) hours as needed for wheezing or shortness of breath. Patient not taking: Reported on 01/16/2017 09/08/16   Maretta Bees, MD  allopurinol (ZYLOPRIM) 100 MG tablet Take 1 tablet (100 mg total) by mouth daily. 01/16/17   Rodolph Bong, MD  atorvastatin (LIPITOR) 40 MG tablet Take 1 tablet (40 mg total) by mouth daily. 10/03/16   Rodolph Bong, MD  budesonide-formoterol [facility]) 160-4.5 MCG/ACT inhaler Inhale 2 puffs into the lungs 2 (two) times daily. Patient not taking: Reported on 01/16/2017 09/08/16   Maretta Bees, MD  diltiazem (CARDIZEM CD) 180 MG 24 hr capsule Take 1 capsule (180 mg total) by mouth daily. 10/03/16   Rodolph Bong, MD  metoprolol tartrate (LOPRESSOR) 50 MG tablet Take 0.5 tablets (25 mg total) by mouth 2 (two) times daily. 12/06/16 03/06/17  Lewayne Bunting, MD  rivaroxaban (XARELTO) 20 MG TABS tablet Take 1 tablet (20 mg total) by mouth daily with supper. 10/03/16   Rodolph Bong, MD    Family History Family History  Problem Relation Age of Onset  . Heart disease Mother   . Gout Father     Social History Social History  Substance Use Topics  . Smoking status: Former Smoker    Packs/day: 0.25    Years: 3.00    Types: Cigarettes  . Smokeless tobacco: Current User    Types: Chew  . Alcohol use Yes     Comment: Occasionally per patient, 550cc per patient a week     Allergies   Patient has no known allergies.   Review of Systems Review of Systems  Constitutional: Negative for chills and fever.  Eyes: Negative for visual disturbance.  Respiratory: Negative for shortness of breath.   Cardiovascular: Negative for chest pain and leg swelling.  Gastrointestinal: Negative for abdominal pain, constipation, diarrhea,  nausea and vomiting.  Genitourinary: Negative for dysuria and hematuria.  Musculoskeletal: Negative for arthralgias and myalgias.  Skin: Negative for color change.  Allergic/Immunologic: Negative for immunocompromised state.  Neurological: Positive for headaches. Negative for syncope, weakness, light-headedness and numbness.  Hematological: Bruises/bleeds easily (on xarelto).  Psychiatric/Behavioral: Positive for suicidal ideas. Negative for confusion, hallucinations and homicidal ideas.   All other systems reviewed and are negative for acute change except as noted in the HPI.    Physical Exam Updated Vital Signs BP (!) 201/140 (BP Location: Left Arm)   Pulse 63   Temp 98.2 F (36.8 C) (Oral)   Resp 16   SpO2 99%  Recheck VS with correct cuff size: BP (!) 156/117 (BP Location: Left Arm)   Pulse (!) 50   Temp 98.2 F (36.8 C) (Oral)   Resp 16   SpO2 98%    Physical Exam  Constitutional: He is oriented to person, place, and time. He appears well-developed and well-nourished.  Non-toxic appearance. No distress.  Afebrile, nontoxic, NAD although tearful during interview, BP 201/140 in triage but small cuff being used; large cuff used and BP 180/124 (cuff still not appropriately sized, will need repeat after more appropriate cuff used).   HENT:  Head: Normocephalic and atraumatic.  Mouth/Throat: Oropharynx is clear and moist and mucous membranes are normal.  Eyes: Pupils are equal, round, and reactive to light. Conjunctivae and EOM are normal. Right eye exhibits no discharge. Left eye exhibits no discharge.  PERRL, EOMI, no nystagmus, no visual field deficits, slight ptosis of R eyelid however pt states this is chronic and unchanged  Neck: Normal range of motion. Neck supple. No spinous process tenderness and no muscular tenderness present. No neck rigidity. Normal range of motion present.  FROM intact without spinous process TTP, no bony stepoffs or deformities, no paraspinous muscle  TTP or muscle spasms. No rigidity or meningeal signs. No bruising or swelling.   Cardiovascular: Normal rate, regular rhythm, normal heart sounds and intact distal pulses.  Exam reveals no gallop and no friction rub.   No murmur heard. Pulmonary/Chest: Effort normal and breath sounds normal. No respiratory distress. He has no decreased breath sounds. He has no wheezes. He has no rhonchi. He has no rales.  Abdominal: Soft. Normal appearance and bowel sounds are normal. He exhibits no distension. There is no tenderness. There is no rigidity, no rebound, no guarding, no CVA tenderness, no tenderness at McBurney's point and negative Murphy's sign.  Musculoskeletal: Normal range of motion.  MAE x4 Strength and sensation grossly intact in all extremities Distal pulses intact Gait steady  Neurological: He is alert and oriented to person, place, and time. He has normal strength. No cranial nerve deficit or sensory deficit. Coordination and gait normal. GCS eye subscore is 4. GCS verbal subscore is 5. GCS motor subscore is 6.  CN 2-12 grossly intact A&O x4 GCS 15 Sensation and strength intact Gait nonataxic including with tandem walking Coordination with finger-to-nose WNL Neg pronator drift   Skin: Skin is warm, dry and intact. No rash noted.  Psychiatric: He is not actively hallucinating. He exhibits a depressed mood. He expresses suicidal ideation. He expresses no homicidal ideation. He expresses suicidal plans. He expresses no homicidal plans.  Tearful during exam, depressed affect, but pleasant and cooperative. Endorsing SI with a plan, denies HI or AVH, doesn't seem to be responding to internal stimuli.   Nursing note and vitals reviewed.    ED Treatments / Results  Labs (all labs ordered are listed, but only abnormal results are displayed) Labs Reviewed  COMPREHENSIVE METABOLIC PANEL - Abnormal; Notable for the following:       Result Value   Sodium 134 (*)    Glucose, Bld 100 (*)     Calcium 10.5 (*)    All other components within normal limits  ACETAMINOPHEN LEVEL - Abnormal; Notable for the following:    Acetaminophen (Tylenol), Serum <10 (*)    All other components within normal limits  ETHANOL  SALICYLATE LEVEL  CBC  RAPID URINE DRUG SCREEN, HOSP PERFORMED    EKG  EKG Interpretation None       Radiology Ct Head Wo Contrast  Result Date: 01/16/2017 CLINICAL DATA:  Pt from home with c/o suicidal thoughts with a plan to hang himself. Pt states he has been having arguments with his wife that end with his wife telling him to hang himself. Pt states he went to [place] yesterday to see his son one  last time and was going to hang himself today. Pt states instead he went to his PCP who told him to come here. Pt states he has been evaluated for his htn and headache and has been put on medications for his htn. Pt is tearful at time of assessment EXAM: CT HEAD WITHOUT CONTRAST TECHNIQUE: Contiguous axial images were obtained from the base of the skull through the vertex without intravenous contrast. COMPARISON:  Orbit CT, 09/27/2011 FINDINGS: Brain: No evidence of acute infarction, hemorrhage, hydrocephalus, extra-axial collection or mass lesion/mass effect. Vascular: No hyperdense vessel or unexpected calcification. Skull: Normal. Negative for fracture or focal lesion. Sinuses/Orbits: Visualize globes and orbits are unremarkable. Visualized sinuses and mastoid air cells are clear. Other: None. IMPRESSION: Normal unenhanced CT scan of the brain. Electronically Signed   By: Amie Portland M.D.   On: 01/16/2017 19:30    Procedures Procedures (including critical care time)  Medications Ordered in ED Medications  metoprolol tartrate (LOPRESSOR) tablet 25 mg (25 mg Oral Given 01/16/17 2025)  diltiazem (CARDIZEM CD) 24 hr capsule 180 mg (180 mg Oral Given 01/16/17 2025)  acetaminophen (TYLENOL) tablet 650 mg (not administered)  zolpidem (AMBIEN) tablet 5 mg (not administered)    ondansetron (ZOFRAN) tablet 4 mg (not administered)  alum & mag hydroxide-simeth (MAALOX/MYLANTA) 200-200-20 MG/5ML suspension 30 mL (not administered)  allopurinol (ZYLOPRIM) tablet 100 mg (not administered)  atorvastatin (LIPITOR) tablet 40 mg (not administered)  rivaroxaban (XARELTO) tablet 20 mg (not administered)  acetaminophen (TYLENOL) tablet 650 mg (650 mg Oral Given 01/16/17 2025)     Initial Impression / Assessment and Plan / ED Course  I have reviewed the triage vital signs and the nursing notes.  Pertinent labs & imaging results that were available during my care of the patient were reviewed by me and considered in my medical decision making (see chart for details).     46 y.o. male here for SI with a plan, was going to hang himself yesterday but decided to see his PCP one last time, who encouraged him to come here for immediate psychiatric attention. C/o HA x3 days related to stress in his life, BP 201/140 on arrival (inappropriately sized cuff; repeat with larger cuff 180/124), took morning BP med but not night time doses. No focal neuro deficits, tearful during exam, very pleasant and cooperative. Denies HI/AVH, drug use, or recent EtOH use; endorses weekend drinking, most recently 2wks ago. Given HTN, will get CT head as well as EKG, in addition to psych clearance labs; will have them recheck BP with more appropriately sized cuff as well. Will give PM doses of BP meds, in addition to tylenol for headache, and reassess after work up done.   8:40 PM EKG with LVH but otherwise no acute ischemic findings, similar to prior EKGs. CT head negative. Repeat BP 150s/110s using more appropriate cuff, home meds due anyway so will give them now but this repeat is reassuring. HA improving. EtOH level undetectable. CBC WNL. CMP essentially unremarkable. Salicylate and acetaminophen levels WNL. UDS unremarkable. Pt medically cleared at this time. Psych hold orders and home med orders placed.  Please see TTS notes for further documentation of care/dispo. PLEASE NOTE THAT PT IS HERE VOLUNTARILY AT THIS TIME, IF PT TRIES TO LEAVE THEY WOULD NEED IVC PAPERWORK TAKEN OUT. Pt stable at time of med clearance.     Final Clinical Impressions(s) / ED Diagnoses   Final diagnoses:  Suicidal ideations  Current moderate episode of major depressive disorder without prior  episode [facility])  Essential hypertension  Nonintractable episodic headache, unspecified headache type    New Prescriptions New Prescriptions   No medications on [address] [place], [place] 01/16/17 2043    Nira Conn, MD 01/17/17 [phone]

## 2017-01-16 NOTE — ED Notes (Addendum)
Pt from home with c/o suicidal thoughts with a plan to hang himself. Pt states he has been having arguments with his wife that end with his wife telling him to hang himself. Pt states he went to [place] yesterday to see his son one last time and was going to hang himself today. Pt states instead he went to his PCP who told him to come here. Pt states he has been evaluated for his htn and headache and has been put on medications for his htn. Pt is tearful at time of assessment

## 2017-01-16 NOTE — ED Notes (Signed)
France Ravens, PA-C informed of pt's VS's.  No new orders received.

## 2017-01-16 NOTE — Progress Notes (Signed)
Billy Beard is a 46 y.o. male who presents to [facility]: [facility] today for depression.   Prophet notes a 2 week history of significant worsening mood. He has lots of stress at home with tension with his wife. He has developed suicidal ideation recently. He was going to hang himself yesterday when he decided to get help instead. He has an active plan. He notes that he will not hurt himself right now because he want to get help and "I could have yesterday". He thinks he is safe to drive to the hospital. He does not want to go home right now because he does not think that home is a good place for him right now.    Past Medical History:  Diagnosis Date  . Asthma   . Atrial fibrillation (HCC)   . Gout   . Hypertension    Past Surgical History:  Procedure Laterality Date  . LEFT HEART CATH AND CORONARY ANGIOGRAPHY N/A 09/08/2016   Procedure: Left Heart Cath and Coronary Angiography;  Surgeon: Lyn Records, MD;  Location: [facility] INVASIVE CV LAB;  Service: Cardiovascular;  Laterality: N/A;  . THYROIDECTOMY  08/2015   Patient unsure how much was removed    Social History  Substance Use Topics  . Smoking status: Former Smoker    Packs/day: 0.25    Years: 3.00    Types: Cigarettes  . Smokeless tobacco: Current User    Types: Chew  . Alcohol use Yes     Comment: Occasionally per patient, 550cc per patient a week   family history includes Gout in his father; Heart disease in his mother.  ROS as above:  Medications: Current Outpatient Prescriptions  Medication Sig Dispense Refill  . albuterol (PROVENTIL HFA;VENTOLIN HFA) 108 (90 Base) MCG/ACT inhaler Inhale 2 puffs into the lungs every 6 (six) hours as needed for wheezing or shortness of breath. (Patient not taking: Reported on 01/16/2017) 1 Inhaler 0  . allopurinol (ZYLOPRIM) 100 MG tablet Take 1 tablet (100 mg total) by mouth  daily. 90 tablet 3  . atorvastatin (LIPITOR) 40 MG tablet Take 1 tablet (40 mg total) by mouth daily. 90 tablet 3  . budesonide-formoterol (SYMBICORT) 160-4.5 MCG/ACT inhaler Inhale 2 puffs into the lungs 2 (two) times daily. (Patient not taking: Reported on 01/16/2017) 1 Inhaler 0  . diltiazem (CARDIZEM CD) 180 MG 24 hr capsule Take 1 capsule (180 mg total) by mouth daily. 90 capsule 0  . metoprolol tartrate (LOPRESSOR) 50 MG tablet Take 0.5 tablets (25 mg total) by mouth 2 (two) times daily. 180 tablet 3  . rivaroxaban (XARELTO) 20 MG TABS tablet Take 1 tablet (20 mg total) by mouth daily with supper. 90 tablet 3   No current facility-administered medications for this visit.    No Known Allergies  Health Maintenance Health Maintenance  Topic Date Due  . INFLUENZA VACCINE  12/20/2016  . TETANUS/TDAP  05/22/2017  . HIV Screening  Completed     Exam:  BP (!) 194/109 (BP Location: Left Arm, Patient Position: Sitting, Cuff Size: Normal)   Pulse 64   Temp 98.2 F (36.8 C) (Oral)   Wt 181 lb (82.1 kg)   SpO2 99%   BMI 32.06 kg/m  Gen: Well NAD HEENT: EOMI,  MMM Lungs: Normal work of breathing. CTABL Heart: RRR no MRG Abd: NABS, Soft. Nondistended, Nontender Exts: Brisk capillary refill, warm and well perfused.  Mood: Tearful affect. Normal speech. No HI.  Pt notes active SI with plan.  He agrees to verbally contract for safety.   Depression screen [facility] 2/9 01/16/2017  Decreased Interest 3  Down, Depressed, Hopeless 3  PHQ - 2 Score 6  Altered sleeping 3  Tired, decreased energy 0  Change in appetite 0  Feeling bad or failure about yourself  3  Trouble concentrating 0  Moving slowly or fidgety/restless 1  Suicidal thoughts 3  PHQ-9 Score 16      No results found for this or any previous visit (from the past 72 hour(s)). No results found.    Assessment and Plan: 46 y.o. male with Depression with suicidal thoughts.  Patient will go directly to [facility] ED for  evaluation and treatment.  He will schedule a follow up visit with me in 2 days and will likely need follow up with psychiatry and psychology as well.   We will also follow up HTN, and Gout at next visit if able.  Active SI takes priority today.   No orders of the defined types were placed in this encounter.  Meds ordered this encounter  Medications  . allopurinol (ZYLOPRIM) 100 MG tablet    Sig: Take 1 tablet (100 mg total) by mouth daily.    Dispense:  90 tablet    Refill:  3     Discussed warning signs or symptoms. Please see discharge instructions. Patient expresses understanding.  I spent 40 minutes with this patient, greater than 50% was face-to-face time counseling regarding suicide safety and plan. Marland Kitchen

## 2017-01-16 NOTE — BH Assessment (Addendum)
Assessment Note  Billy Beard is an 46 y.o. male. Patient had a appointment with his PCP Billy Beard) this evening. States that he told his PCP that he was under a lot of stress. He told his doctor that he had suicidal thoughts with plan to hang himself. The PCP recommended he come to [facility] for a psychiatric assessment. Patient has a lot of marital stress. He complains that his spouse will not have sex with him. The wife sleeps in the be with their daughter and refuses to lay in the bed with him. He argues with his wife tell her he has needs. States that they argued so bad over the sex issus last Thursday he called the police on her. States that when the police arrived he told them that he wanted to legally separate from her. Patient feels embarrassed about calling the police because his in-laws from [place] witnessed this domestic disputet. He says that his in-laws are living with his family for 6 months and then returning to [place]. He further explains that his wife makes him feel bad about his eye deformity. Writer observed that patient has one eye bigger than the other. Sts, "My wife calls me a Dedda". He says, "This is a [place] word that Americans refer to as..."cross eyed". She has also asked him to get another wife. He explains that his culture having multiple wives is acceptable but he doesn't want to do this at his age.   Patient is suicidal with a plan to hang himself. He feels this way because his wife frequently says, "Go out in the back of the house and hang yourself". He has access to means such as bed sheets and ropes. He has many trees in the back of his house. No history of suicide attempts and/or gestures. No self mutilating behaviors. No HI. He is calm and cooperative. He denies legal issues. No AVH's. He socially drinks on the weekend. No drug use.  No history of INPT treatment. He does not have a outpatient mental health provider.   Diagnosis: Major Depressive Disorder, Single Episode,  Severe, without psychotic features  Past Medical History:  Past Medical History:  Diagnosis Date  . Asthma   . Atrial fibrillation (HCC)   . Gout   . Hypertension     Past Surgical History:  Procedure Laterality Date  . LEFT HEART CATH AND CORONARY ANGIOGRAPHY N/A 09/08/2016   Procedure: Left Heart Cath and Coronary Angiography;  Surgeon: Lyn Records, MD;  Location: [facility] INVASIVE CV LAB;  Service: Cardiovascular;  Laterality: N/A;  . THYROIDECTOMY  08/2015   Patient unsure how much was removed     Family History:  Family History  Problem Relation Age of Onset  . Heart disease Mother   . Gout Father     Social History:  reports that he has quit smoking. His smoking use included Cigarettes. He has a 0.75 pack-year smoking history. His smokeless tobacco use includes Chew. He reports that he drinks alcohol. He reports that he does not use drugs.  Additional Social History:  Alcohol / Drug Use Pain Medications: SEE MAR Prescriptions: SEE MAR Over the Counter: SEe MAR History of alcohol / drug use?: Yes Substance #1 Name of Substance 1: Alcohol  1 - Age of First Use: 46 yrs old  1 - Amount (size/oz): 2-3 large beers per day throughout a weekend 1 - Frequency: weekends  1 - Duration: on-gonig  1 - Last Use / Amount: "2 weeks ago"  CIWA:  CIWA-Ar BP: (!) 156/117 Pulse Rate: (!) 50 COWS:    Allergies: No Known Allergies  Home Medications:  (Not in a hospital admission)  OB/GYN Status:  No LMP for male patient.  General Assessment Data Assessment unable to be completed:  (BCBS) TTS Assessment: In system Is this a Tele or Face-to-Face Assessment?: Face-to-Face Is this an Initial Assessment or a Re-assessment for this encounter?: Initial Assessment Marital status: Married [place] name:  (n/a) Is patient pregnant?: No Pregnancy Status: No Living Arrangements: Spouse/significant other, Children (spouse, 1 child, in-laws from [place] visiting-6 months ) Can pt return to  current living arrangement?: Yes Admission Status: Voluntary Is patient capable of signing voluntary admission?: Yes Referral Source: Self/Family/Friend     Crisis Care Plan Living Arrangements: Spouse/significant other, Children (spouse, 1 child, in-laws from [place] visiting-6 months ) Legal Guardian: Other: (no guardian ) Name of Psychiatrist:  (no psychiatrist ) Name of Therapist:  (no therapist )  Education Status Is patient currently in school?: No Current Grade:  (n/a) Highest grade of school patient has completed:  (High School in [place] ) Name of school:  (n/a) Contact person:  (n/a)  Risk to self with the past 6 months Suicidal Ideation: Yes-Currently Present Has patient been a risk to self within the past 6 months prior to admission? : Yes Suicidal Intent: Yes-Currently Present Has patient had any suicidal intent within the past 6 months prior to admission? : Yes Is patient at risk for suicide?: Yes Suicidal Plan?: Yes-Currently Present Has patient had any suicidal plan within the past 6 months prior to admission? : Yes Specify Current Suicidal Plan:  ("To hang myself") Access to Means: Yes ("I have trees in the back of my house in [place]") Specify Access to Suicidal Means:  ("I can use anything....bed sheets...anything can be used") What has been your use of drugs/alcohol within the last 12 months?:  (patient denies ) Previous Attempts/Gestures: No How many times?:  (0) Other Self Harm Risks:  (denies ) Triggers for Past Attempts: Other (Comment) (patient denies previous attempts and/or gestures ) Intentional Self Injurious Behavior: None Family Suicide History: No Recent stressful life event(s): Other (Comment), Conflict (Comment) ("My wife will not have sex with me..picks on me") Persecutory voices/beliefs?: No Depression: Yes Depression Symptoms: Feeling angry/irritable, Feeling worthless/self pity, Loss of interest in usual pleasures, Fatigue,  Isolating Substance abuse history and/or treatment for substance abuse?: No Suicide prevention information given to non-admitted patients: Not applicable  Risk to Others within the past 6 months Homicidal Ideation: No Does patient have any lifetime risk of violence toward others beyond the six months prior to admission? : No Thoughts of Harm to Others: No Current Homicidal Intent: No Current Homicidal Plan: No Access to Homicidal Means: No Identified Victim:  (n/a) History of harm to others?: No Assessment of Violence: None Noted Violent Behavior Description:  (patient is calm and cooperative ) Does patient have access to weapons?: No Criminal Charges Pending?: No Does patient have a court date: No Is patient on probation?: Yes  Psychosis Hallucinations: None noted Delusions: None noted  Mental Status Report Appearance/Hygiene: Disheveled Eye Contact: Good Motor Activity: Freedom of movement Speech: Logical/coherent Level of Consciousness: Alert Mood: Depressed Affect: Depressed Anxiety Level: Minimal Thought Processes: Relevant Judgement: Impaired Orientation: Place, Time, Situation, Person Obsessive Compulsive Thoughts/Behaviors: None  Cognitive Functioning Concentration: Decreased Memory: Recent Intact, Remote Intact IQ: Average Insight: Fair Impulse Control: Poor Appetite: Fair Weight Loss:  (none reported) Weight Gain:  (none reported) Sleep: Decreased  Total Hours of Sleep:  (past 2 weeks not sleeping well ) Vegetative Symptoms: None  ADLScreening [facility] Assessment Services) Patient's cognitive ability adequate to safely complete daily activities?: Yes Patient able to express need for assistance with ADLs?: Yes Independently performs ADLs?: Yes (appropriate for developmental age)  Prior Inpatient Therapy Prior Inpatient Therapy: No Prior Therapy Dates:  (n/a) Prior Therapy Facilty/Provider(s):  (n/a) Reason for Treatment:  (n/a)  Prior Outpatient  Therapy Prior Outpatient Therapy: No Prior Therapy Dates:  (n/a) Prior Therapy Facilty/Provider(s):  (n/a) Reason for Treatment:  (n/a) Does patient have an ACCT team?: No Does patient have Intensive In-House Services?  : No Does patient have [facility] services? : No Does patient have P4CC services?: No  ADL Screening (condition at time of admission) Patient's cognitive ability adequate to safely complete daily activities?: Yes Is the patient deaf or have difficulty hearing?: No Does the patient have difficulty seeing, even when wearing glasses/contacts?: No Does the patient have difficulty concentrating, remembering, or making decisions?: No Patient able to express need for assistance with ADLs?: Yes Does the patient have difficulty dressing or bathing?: No Independently performs ADLs?: Yes (appropriate for developmental age) Does the patient have difficulty walking or climbing stairs?: No Weakness of Legs: None Weakness of Arms/Hands: None  Home Assistive Devices/Equipment Home Assistive Devices/Equipment: None    Abuse/Neglect Assessment (Assessment to be complete while patient is alone) Physical Abuse: Denies Verbal Abuse: Denies Sexual Abuse: Denies Exploitation of patient/patient's resources: Denies Self-Neglect: Denies Values / Beliefs Cultural Requests During Hospitalization: None Spiritual Requests During Hospitalization: None   Advance Directives (For Healthcare) Does Patient Have a Medical Advance Directive?: No Would patient like information on creating a medical advance directive?: No - Patient declined Nutrition Screen- [facility] Adult/WL/AP Patient's home diet: Regular  Additional Information 1:1 In Past 12 Months?: No CIRT Risk: No Elopement Risk: No Does patient have medical clearance?: Yes     Disposition: Per Donell Sievert, PA, patient meets criteria for INPT treatment. TTS to seek placement. Per [facility], [facility] has [facility] bed 407-1. However, patient's BP is not  therapeutic. The [facility] would like 3 consecutive therapeutic blood pressures prior to his [facility] admission. Writer informed Dr. Clayborne Dana of the need for further medical clearance before [facility] will accept him.  Disposition Initial Assessment Completed for this Encounter: Yes  On Site Evaluation by:   Reviewed with Physician:    Melynda Ripple 01/16/2017 9:55 PM

## 2017-01-16 NOTE — ED Notes (Signed)
Pt stated "do you see my eye?  People don't want to be around you.  My wife tells me just to hang myself.  I went to see my son @ [facility].  I wanted to see him one last time."

## 2017-01-16 NOTE — ED Notes (Signed)
TTS assessment in progress. 

## 2017-01-16 NOTE — ED Notes (Signed)
Bed: WA26 Expected date:  Expected time:  Means of arrival:  Comments: TR3 

## 2017-01-16 NOTE — Patient Instructions (Addendum)
Thank you for coming in today. Please go directly to [facility] Emergency Room.   [address] [place], [place], [place] [address]  We will let them know you are coming.   You may have to wait but they will take care of you tonight.   Make an appointment with me Thursday for Mood.   Suicidal Feelings: How to Help Yourself Suicide is the taking of one's own life. If you feel as though life is getting too tough to handle and are thinking about suicide, get help right away. To get help:  Call your local emergency services (911 in the [place]  Call a suicide hotline to speak with a trained counselor who understands how you are feeling. The following is a list of suicide hotlines in the [place]. For a list of hotlines in [place], visit [employer]kDistributor.it. ? 1-800-273-TALK (408) 545-0528). ? 1-800-SUICIDE (201) 412-8174). ? 4370615653. This is a hotline for Spanish speakers. ? 1-800-799-4TTY 204-007-7417). This is a hotline for TTY users. ? 1-866-4-U-TREVOR 419-878-2897). This is a hotline for lesbian, gay, bisexual, transgender, or questioning youth.  Contact a crisis center or a local suicide prevention center. To find a crisis center or suicide prevention center: ? Call your local hospital, clinic, community service organization, mental health center, social service provider, or health department. Ask for assistance in connecting to a crisis center. ? Visit https://www.patel-king.com/ for a list of crisis centers in the [place], or visit www.suicideprevention.ca/thinking-about-suicide/find-a-crisis-centre for a list of centers in [place].  Visit the following websites: ? National Suicide Prevention Lifeline: www.suicidepreventionlifeline.org ? Hopeline: www.hopeline.com ? [employer] for Suicide Prevention: https://www.ayers.com/ ? [employer] (for lesbian, gay, bisexual, transgender, or  questioning youth): www.thetrevorproject.org  How can I help myself feel better?  Promise yourself that you will not do anything drastic when you have suicidal feelings. Remember, there is hope. Many people have gotten through suicidal thoughts and feelings, and you will, too. You may have gotten through them before, and this proves that you can get through them again.  Let family, friends, teachers, or counselors know how you are feeling. Try not to isolate yourself from those who care about you. Remember, they will want to help you. Talk with someone every day, even if you do not feel sociable. Face-to-face conversation is best.  Call a mental health professional and see one regularly.  Visit your primary health care provider every year.  Eat a well-balanced diet, and space your meals so you eat regularly.  Get plenty of rest.  Avoid alcohol and drugs, and remove them from your home. They will only make you feel worse.  If you are thinking of taking a lot of medicine, give your medicine to someone who can give it to you one day at a time. If you are on antidepressants and are concerned you will overdose, let your health care provider know so he or she can give you safer medicines. Ask your mental health professional about the possible side effects of any medicines you are taking.  Remove weapons, poisons, knives, and anything else that could harm you from your home.  Try to stick to routines. Follow a schedule every day. Put self-care on your schedule.  Make a list of realistic goals, and cross them off when you achieve them. Accomplishments give a sense of worth.  Wait until you are feeling better before doing the things you find difficult or unpleasant.  Exercise if you are able. You will feel better if you exercise for even  a half hour each day.  Go out in the sun or into nature. This will help you recover from depression faster. If you have a favorite place to walk, go  there.  Do the things that have always given you pleasure. Play your favorite music, read a good book, paint a picture, play your favorite instrument, or do anything else that takes your mind off your depression if it is safe to do.  Keep your living space well lit.  When you are feeling well, write yourself a letter about tips and support that you can read when you are not feeling well.  Remember that life's difficulties can be sorted out with help. Conditions can be treated. You can work on thoughts and strategies that serve you well. This information is not intended to replace advice given to you by your health care provider. Make sure you discuss any questions you have with your health care provider. Document Released: 11/12/2002 Document Revised: 01/05/2016 Document Reviewed: 09/02/2013 Elsevier Interactive Patient Education  2017 [employer].

## 2017-01-17 ENCOUNTER — Inpatient Hospital Stay (HOSPITAL_COMMUNITY)
Admission: AD | Admit: 2017-01-17 | Discharge: 2017-01-19 | DRG: 885 | Disposition: A | Discharge status: Home / Self Care | Payer: BLUE CROSS/BLUE SHIELD | Source: Intra-hospital | Attending: Psychiatry | Admitting: Psychiatry

## 2017-01-17 ENCOUNTER — Encounter (HOSPITAL_COMMUNITY): Payer: Self-pay

## 2017-01-17 ENCOUNTER — Telehealth: Payer: Self-pay | Admitting: Family Medicine

## 2017-01-17 DIAGNOSIS — R7303 Prediabetes: Secondary | ICD-10-CM | POA: Diagnosis present

## 2017-01-17 DIAGNOSIS — I480 Paroxysmal atrial fibrillation: Secondary | ICD-10-CM | POA: Diagnosis present

## 2017-01-17 DIAGNOSIS — F17210 Nicotine dependence, cigarettes, uncomplicated: Secondary | ICD-10-CM | POA: Diagnosis present

## 2017-01-17 DIAGNOSIS — M1000 Idiopathic gout, unspecified site: Secondary | ICD-10-CM | POA: Diagnosis present

## 2017-01-17 DIAGNOSIS — G4700 Insomnia, unspecified: Secondary | ICD-10-CM | POA: Diagnosis not present

## 2017-01-17 DIAGNOSIS — I10 Essential (primary) hypertension: Secondary | ICD-10-CM | POA: Diagnosis present

## 2017-01-17 DIAGNOSIS — E781 Pure hyperglyceridemia: Secondary | ICD-10-CM | POA: Diagnosis present

## 2017-01-17 DIAGNOSIS — R45851 Suicidal ideations: Secondary | ICD-10-CM | POA: Diagnosis present

## 2017-01-17 DIAGNOSIS — F1099 Alcohol use, unspecified with unspecified alcohol-induced disorder: Secondary | ICD-10-CM

## 2017-01-17 DIAGNOSIS — F332 Major depressive disorder, recurrent severe without psychotic features: Secondary | ICD-10-CM | POA: Diagnosis present

## 2017-01-17 DIAGNOSIS — Z7901 Long term (current) use of anticoagulants: Secondary | ICD-10-CM

## 2017-01-17 DIAGNOSIS — F17220 Nicotine dependence, chewing tobacco, uncomplicated: Secondary | ICD-10-CM

## 2017-01-17 DIAGNOSIS — F325 Major depressive disorder, single episode, in full remission: Secondary | ICD-10-CM | POA: Diagnosis present

## 2017-01-17 DIAGNOSIS — Z6833 Body mass index (BMI) 33.0-33.9, adult: Secondary | ICD-10-CM

## 2017-01-17 DIAGNOSIS — F419 Anxiety disorder, unspecified: Secondary | ICD-10-CM | POA: Diagnosis not present

## 2017-01-17 DIAGNOSIS — J45909 Unspecified asthma, uncomplicated: Secondary | ICD-10-CM | POA: Diagnosis present

## 2017-01-17 DIAGNOSIS — Z8249 Family history of ischemic heart disease and other diseases of the circulatory system: Secondary | ICD-10-CM

## 2017-01-17 DIAGNOSIS — E669 Obesity, unspecified: Secondary | ICD-10-CM | POA: Diagnosis present

## 2017-01-17 DIAGNOSIS — M109 Gout, unspecified: Secondary | ICD-10-CM | POA: Diagnosis present

## 2017-01-17 MED ORDER — ALUM & MAG HYDROXIDE-SIMETH 200-200-20 MG/5ML PO SUSP: 30.0000 mL | ORAL | Status: DC | PRN

## 2017-01-17 MED ORDER — MAGNESIUM HYDROXIDE 400 MG/5ML PO SUSP: 30.0000 mL | Freq: Every day | ORAL | Status: DC | PRN

## 2017-01-17 MED ORDER — TRAZODONE HCL 50 MG PO TABS
50.0000 mg | ORAL_TABLET | Freq: Every evening | ORAL | Status: DC | PRN
  Administered 2017-01-17 – 2017-01-18 (×2): 50 mg via ORAL
  Filled 2017-01-17 (×9): qty 1

## 2017-01-17 MED ORDER — DILTIAZEM HCL ER COATED BEADS 180 MG PO CP24
180.0000 mg | ORAL_CAPSULE | Freq: Every day | ORAL | Status: DC
  Administered 2017-01-17 – 2017-01-19 (×3): 180 mg via ORAL
  Filled 2017-01-17 (×5): qty 1

## 2017-01-17 MED ORDER — BUPROPION HCL ER (XL) 150 MG PO TB24
150.0000 mg | ORAL_TABLET | Freq: Every day | ORAL | Status: DC
  Administered 2017-01-17: 150 mg via ORAL
  Filled 2017-01-17 (×3): qty 1

## 2017-01-17 MED ORDER — NICOTINE 21 MG/24HR TD PT24
21.0000 mg | MEDICATED_PATCH | Freq: Every day | TRANSDERMAL | Status: DC
  Administered 2017-01-17 – 2017-01-19 (×3): 21 mg via TRANSDERMAL
  Filled 2017-01-17 (×5): qty 1

## 2017-01-17 MED ORDER — ALBUTEROL SULFATE HFA 108 (90 BASE) MCG/ACT IN AERS: 2.0000 | INHALATION_SPRAY | Freq: Four times a day (QID) | RESPIRATORY_TRACT | Status: DC | PRN

## 2017-01-17 MED ORDER — ACETAMINOPHEN 325 MG PO TABS: 650.0000 mg | ORAL_TABLET | Freq: Four times a day (QID) | ORAL | Status: DC | PRN

## 2017-01-17 MED ORDER — RIVAROXABAN 20 MG PO TABS
20.0000 mg | ORAL_TABLET | Freq: Every day | ORAL | Status: DC
  Administered 2017-01-17 – 2017-01-18 (×2): 20 mg via ORAL
  Filled 2017-01-17 (×4): qty 1

## 2017-01-17 MED ORDER — METOPROLOL TARTRATE 25 MG PO TABS
25.0000 mg | ORAL_TABLET | Freq: Two times a day (BID) | ORAL | Status: DC
  Administered 2017-01-17 – 2017-01-19 (×3): 25 mg via ORAL
  Filled 2017-01-17 (×9): qty 1

## 2017-01-17 MED ORDER — ALLOPURINOL 100 MG PO TABS
100.0000 mg | ORAL_TABLET | Freq: Every day | ORAL | Status: DC
  Administered 2017-01-17 – 2017-01-19 (×3): 100 mg via ORAL
  Filled 2017-01-17 (×5): qty 1

## 2017-01-17 MED ORDER — ATORVASTATIN CALCIUM 40 MG PO TABS
40.0000 mg | ORAL_TABLET | Freq: Every day | ORAL | Status: DC
  Administered 2017-01-17 – 2017-01-19 (×3): 40 mg via ORAL
  Filled 2017-01-17 (×5): qty 1

## 2017-01-17 MED ORDER — MOMETASONE FURO-FORMOTEROL FUM 200-5 MCG/ACT IN AERO
2.0000 | INHALATION_SPRAY | Freq: Two times a day (BID) | RESPIRATORY_TRACT | Status: DC
  Administered 2017-01-17 – 2017-01-19 (×5): 2 via RESPIRATORY_TRACT
  Filled 2017-01-17: qty 8.8

## 2017-01-17 MED ORDER — SERTRALINE HCL 25 MG PO TABS
25.0000 mg | ORAL_TABLET | Freq: Every day | ORAL | Status: DC
  Administered 2017-01-17 – 2017-01-18 (×2): 25 mg via ORAL
  Filled 2017-01-17 (×3): qty 1

## 2017-01-17 NOTE — Progress Notes (Signed)
[facility] Group Notes:  (Nursing/MHT/Case Management/Adjunct)  Date:  01/17/2017  Time:  2100 Type of Therapy:  wrap up group  Participation Level:  Active  Participation Quality:  Appropriate, Attentive, Sharing and Supportive  Affect:  Appropriate  Cognitive:  Appropriate  Insight:  Improving  Engagement in Group:  Engaged  Modes of Intervention:  Clarification, Education and Support  Summary of Progress/Problems:Pt shared that it was good that he woke up today. Pt shared that he has been married for 22 years and his wife is why he is here because of the stress she brings to his life. Pt says family is number one to him and that is what he is most grateful for. Pt had several visitors including his wife with whom he stated he had a very good visit. Pt shared that his wife was very negative towards him before coming to the hospital but now very positive towards him.   Marcille BuffyMcNeil, Ahri Olson S 01/17/2017, 10:23 PM

## 2017-01-17 NOTE — [facility] Suicide Risk Assessment (Signed)
[facility] Admission Suicide Risk Assessment   Nursing information obtained from:  Patient Demographic factors:  Male Current Mental Status:  Suicidal ideation indicated by patient Loss Factors:  Loss of significant relationship Historical Factors:  NA Risk Reduction Factors:  Responsible for children under 44 years of age, Sense of responsibility to family, Religious beliefs about death, Employed, Living with another person, especially a relative, Positive social support  Total Time spent with patient: 45 minutes Principal Problem:  Adjustment Disorder with Depressed Mood, Suicidal Ideations Diagnosis:   Patient Active Problem List   Diagnosis Date Noted  . MDD (major depressive disorder), recurrent episode, severe (HCC) [F33.2] 01/17/2017  . Depression, major, single episode, severe (HCC) [F32.2] 01/16/2017  . Elevated troponin [R74.8] 09/06/2016  . Diastolic dysfunction [I51.9] 08/31/2016  . Paroxysmal atrial fibrillation (HCC) [I48.0] 02/01/2016  . Hypertriglyceridemia [E78.1] 08/23/2015  . Prediabetes [R73.03] 08/23/2015  . Gout [M10.9] 08/06/2015  . HTN (hypertension) [I10] 12/09/2014  . Obesity [E66.9] 12/09/2014  . Smoker [F17.200] 12/09/2014    Continued Clinical Symptoms:  Alcohol Use Disorder Identification Test Final Score (AUDIT): 0 The "Alcohol Use Disorders Identification Test", Guidelines for Use in Primary Care, Second Edition.  World Science writer [facility]). Score between 0-7:  no or low risk or alcohol related problems. Score between 8-15:  moderate risk of alcohol related problems. Score between 16-19:  high risk of alcohol related problems. Score 20 or above:  warrants further diagnostic evaluation for alcohol dependence and treatment.   CLINICAL FACTORS:  46 year old male . Married x 22 years , has two adult children. Employed. No legal issues. He is originally from [place].   Patient reports he had been experiencing headaches, so went to his PCP. States PCP was  concerned and he told PCP he has been having increased marital discord, tension, and has been thinking of separating. He reported he was having recent suicidal ideations, with thoughts of hanging self, and was referred to ED.   He states he has been feeling depressed for several months, but feels it has worsened related to family/ marital issues .  He ruminates about marital issues, states he feels wife has become distant, critical, and wanting to divorce him. He  states during recent argument, " she told me to go hang myself ".  He denies any neuro-vegetative symptoms, states sleep, energy level have been "OK" Although still depressed, states he is feeling better today because he had a good conversation with his wife yesterday and feels she is more concerned and more understanding at this time  No prior psychiatric admissions, no history of prior suicidal attempts, no history of self cutting, denies history of psychosis, denies any history of violence, states he has never been on any psychiatric medications in the past .   Denies alcohol or drug abuse .  History of HTN, Gout, Atrial Fibrillation.  Denies family psychiatric history   Dx- Adjustment Disorder with Depressed Mood, Suicidal Ideations   Plan - inpatient admission .  Patient has been started on Wellbutrin XL , but based on history of Cardiovascular illness, we discussed other options. D/C Wellbutrin Start Zoloft 25 mgrs QDAY initially for depression, anxiety  Musculoskeletal: Strength & Muscle Tone: within normal limits Gait & Station: normal Patient leans: N/A  Psychiatric Specialty Exam: Physical Exam  ROS states he has no headache today, no chest pain, no shortness of breath, no vomiting   Blood pressure (!) 138/92, pulse (!) 53, temperature 98.6 F (37 C), temperature source Oral, resp.  rate 18, height 5\' 1"  (1.549 m), weight 80.7 kg (178 lb), SpO2 99 %.Body mass index is 33.63 kg/m.  General Appearance: Fairly Groomed   Eye Contact:  Fair  Speech:  Normal Rate  Volume:  Normal  Mood:  depressed, but reports he is feeling better today  Affect:  vaguely constricted  Thought Process:  Linear and Descriptions of Associations: Intact  Orientation:  Other:  fully alert and attentive  Thought Content:  no hallucinations, no delusions, not internally preoccupied   Suicidal Thoughts:  denies current suicidal or self injurious ideations at this time and contracts for safety on unit at this time  Homicidal Thoughts:  No denies any homicidal or violent ideations , and in particular also denies any homicidal or violent ideations towards his wife   Memory:  recent and remote grossly intact   Judgement:  Fair  Insight:  Fair  Psychomotor Activity:  Normal  Concentration:  Concentration: Good and Attention Span: Good  Recall:  Good  Fund of Knowledge:  Good  Language:  Good  Akathisia:  No  Handed:  Right  AIMS (if indicated):     Assets:  Communication Skills Desire for Improvement Resilience Others:  employed  ADL's:  Intact  Cognition:  WNL  Sleep:  Number of Hours: 3.25      COGNITIVE FEATURES THAT CONTRIBUTE TO RISK:  Closed-mindedness and Loss of executive function    SUICIDE RISK:   Moderate:  Frequent suicidal ideation with limited intensity, and duration, some specificity in terms of plans, no associated intent, good self-control, limited dysphoria/symptomatology, some risk factors present, and identifiable protective factors, including available and accessible social support.  PLAN OF CARE: Patient will be admitted to inpatient psychiatric unit for stabilization and safety. Will provide and encourage milieu participation. Provide medication management and maked adjustments as needed.  Will follow daily.    I certify that inpatient services furnished can reasonably be expected to improve the patient's condition.   Craige CottaFernando A Alfredo Spong, MD 01/17/2017, 11:05 AM
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
## 2017-01-17 NOTE — Plan of Care (Signed)
Problem: Education: Goal: Knowledge of [facility] Education information/materials will improve Outcome: Progressing Patient has adjusted to the unit without any questions or concerns. Patient verbalizes understanding of information provided.

## 2017-01-17 NOTE — Progress Notes (Signed)
Patient requests his keys be taken out of his locker (#27) and given to his wife to remove his car from [address]. Address of [address] provided to patient. Set of keys removed from locker by security and Clinical research associatewriter and given to patient's wife. Written and verbal consent obtained from patient. Belongings form updated.

## 2017-01-17 NOTE — Telephone Encounter (Signed)
Lauren Child psychotherapistocial Worker with [facility] called today lvm to adv that patient will not be able to make his 2 day f/up with Dr. Clementeen GrahamEvan Corey because he will still be in the hospital and will need to be rescheduled. Lvm for Lauren to call bac and canceled appt for now. Thanks
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
## 2017-01-17 NOTE — Progress Notes (Signed)
Recreation Therapy Notes  Date:  01/17/17 Time: 0930 Location: [address] Dayroom  Group Topic: Stress Management  Goal Area(s) Addresses:  Patient will verbalize importance of using healthy stress management.  Patient will identify positive emotions associated with healthy stress management.   Intervention: Stress Management  Activity :  Guided Imagery.  LRT introduced the stress management technique of guided imagery.  LRT read a script so patients could follow along and imagine being in their peaceful place.  Patients were to follow along as LRT read the script to engage in the activity.  Education: Stress Management, Discharge Planning.   Education Outcome: Acknowledges edcuation/In group clarification offered/Needs additional education  Clinical Observations/Feedback:  Pt did not attend group.    Caroll RancherMarjette Patria Warzecha, LRT/CTRS        Caroll RancherLindsay, Ellenor Wisniewski A 01/17/2017 12:28 PM

## 2017-01-17 NOTE — Progress Notes (Signed)
Nursing Progress Note 1900-0730  D) Patient presents with blunted affect but brightens upon interaction. Patient reports, "I had a really good day" and denies concerns for writer this evening. Patient denies SI/HI/AVH or pain. Patient compliant with medications and attended group. Patient interacting appropriately with visitors this evening and his peers.  A) Emotional support given. 1:1 interaction and active listening provided. Patient medicated as prescribed. Medications and plan of care reviewed with patient. Patient verbalized understanding without further questions. Snacks and fluids provided. Opportunities for questions or concerns presented to patient. Patient encouraged to continue to work on treatment goals. Labs, vital signs and patient behavior monitored throughout shift. Patient safety maintained with q15 min safety checks. Low fall risk precautions in place and reviewed with patient; patient verbalized understanding.  R) Patient receptive to interaction with nurse. Patient remains safe on the unit at this time. Patient denies any adverse medication reactions at this time. Patient is resting in bed without complaints. Will continue to monitor.

## 2017-01-17 NOTE — BHH Counselor (Signed)
Adult Comprehensive Assessment  Patient ID: Billy OlpSunil Diprima, male   DOB: 1970-09-22, 46 y.o.   MRN: [id number]  Information Source: Information source: Patient  Current Stressors:  Educational / Learning stressors: None reported Employment / Job issues: None reported Family Relationships: he and his wife have been arguing frequently Surveyor, quantityinancial / Lack of resources (include bankruptcy): None reported Housing / Lack of housing: None reported Physical health (include injuries & life threatening diseases): None reported Social relationships: None reported Substance abuse: None reported Bereavement / Loss: Mother is deceased  Living/Environment/Situation:  Living Arrangements: Spouse/significant other Living conditions (as described by patient or guardian): lives with wife, son, daughter; in-laws are staying for 76mo How long has patient lived in current situation?: since marriage What is atmosphere in current home: Chaotic  Family History:  Marital status: Married Number of Years Married: 22 What types of issues is patient dealing with in the relationship?: have been fighting often  Does patient have children?: Yes How many children?: 2 How is patient's relationship with their children?: good relationship with son and daughter  Childhood History:  By whom was/is the patient raised?: Both parents Description of patient's relationship with caregiver when they were a child: good relationship with parents growing Patient's description of current relationship with people who raised him/her: mother is deceased; talks often with dad Does patient have siblings?: Yes Number of Siblings: 1 Description of patient's current relationship with siblings: good relationship with brother who lives with father Did patient suffer any verbal/emotional/physical/sexual abuse as a child?: No Did patient suffer from severe childhood neglect?: No Has patient ever been sexually abused/assaulted/raped as an  adolescent or adult?: No Was the patient ever a victim of a crime or a disaster?: No Witnessed domestic violence?: No Has patient been effected by domestic violence as an adult?: No  Education:  Highest grade of school patient has completed: 12th grade Currently a student?: No Learning disability?: No  Employment/Work Situation:   Employment situation: Employed Where is patient currently employed?: [employer], [employer] How long has patient been employed?: 5377yrs What is the longest time patient has a held a job?: 4254yrs Where was the patient employed at that time?: unknown Has patient ever been in the [employer] and Companymilitary?: No Has patient ever served in combat?: No Did You Receive Any Psychiatric Treatment/Services While in Equities traderthe Military?: No Are There Guns or Other Weapons in Your Home?: No  Financial Resources:   Financial resources: Income from employment, Income from spouse, Private insurance Does patient have a representative payee or guardian?: No  Alcohol/Substance Abuse:   What has been your use of drugs/alcohol within the last 12 months?: Pt drinks beer on weekends- a couple of beers Alcohol/Substance Abuse Treatment Hx: Denies past history Has alcohol/substance abuse ever caused legal problems?: No  Social Support System:   Conservation officer, natureatient's Community Support System: Production assistant, radioGood Describe Community Support System: father, brother, in laws Type of faith/religion: Hinduism How does patient's faith help to cope with current illness?: prays, go to temple once a year  Leisure/Recreation:   Leisure and Hobbies: playing video games, card games  Strengths/Needs:   What things does the patient do well?: helps community with citizenship and driving test In what areas does patient struggle / problems for patient: n/a  Discharge Plan:   Does patient have access to transportation?: Yes Will patient be returning to same living situation after discharge?: Yes Currently receiving community mental health services: No  (PCP for meds) If no, would patient like referral for services when discharged?: Yes (What  county?) (would like therapist) Does patient have financial barriers related to discharge medications?: No  Summary/Recommendations:     Patient is a 46 year old male with a diagnosis of Major Depressive Disorder, single episode. Pt presented to the hospital with thoughts of suicide with a plan to hang himself. Pt reports primary trigger(s) for admission include marital conflict. Patient will benefit from crisis stabilization, medication evaluation, group therapy and psycho education in addition to case management for discharge planning. At discharge it is recommended that Pt remain compliant with established discharge plan and continued treatment.   Verdene Lennert. 01/17/2017

## 2017-01-17 NOTE — ED Notes (Signed)
Pt provided room # for [facility].

## 2017-01-17 NOTE — [facility] Group Notes (Signed)
[facility] 01/17/2017 1:15pm  Type of Therapy: Mental Health Association Presentation  Participation Level: Pt did not attend; was meeting with MD and CSW.  Vernie ShanksLauren Keilen Kahl, LCSW 01/17/2017 3:20 PM

## 2017-01-17 NOTE — Plan of Care (Signed)
Problem: Safety: Goal: Periods of time without injury will increase Outcome: Progressing Patient is on q15 minute safety checks and low fall risk precautions. Patient contracts for safety on the unit and remains safe at this time.   

## 2017-01-18 ENCOUNTER — Ambulatory Visit: Payer: BLUE CROSS/BLUE SHIELD | Admitting: Family Medicine

## 2017-01-18 MED ORDER — SERTRALINE HCL 50 MG PO TABS
50.0000 mg | ORAL_TABLET | Freq: Every day | ORAL | Status: DC
  Administered 2017-01-19: 50 mg via ORAL
  Filled 2017-01-18 (×3): qty 1

## 2017-01-18 NOTE — Progress Notes (Signed)
Date: 01/18/2017 Time: 0900 Group: Nursing  Patient came in late, however, he participated.  He appeared engaged with his peers.  He listened attentively and asked appropriate questions.  He hopes for discharge tomorrow.  Vesta Mixeraroline B., RN

## 2017-01-18 NOTE — [facility] Suicide Risk Assessment (Signed)
[facility] INPATIENT:  Family/Significant Other Suicide Prevention Education  Suicide Prevention Education:  Contact Attempts: Modena SlaterRupa Kc, Pt's wife [phone], (name of family member/significant other) has been identified by the patient as the family member/significant other with whom the patient will be residing, and identified as the person(s) who will aid the patient in the event of a mental health crisis.  With written consent from the patient, two attempts were made to provide suicide prevention education, prior to and/or following the patient's discharge.  We were unsuccessful in providing suicide prevention education.  A suicide education pamphlet was given to the patient to share with family/significant other.  Date and time of first attempt: 01/17/17 @ 0325 Date and time of second attempt: 01/18/17 @ 0349  Verdene LennertLauren C Shanterica Biehler 01/18/2017, 3:48 PM
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
## 2017-01-18 NOTE — [facility] Group Notes (Signed)
Pt attended spiritual care group on grief and loss facilitated by chaplain Nandana Krolikowski   Group opened with brief discussion and psycho-social ed around grief and loss in relationships and in relation to self - identifying life patterns, circumstances, changes that cause losses. Established group norm of speaking from own life experience. Group goal of establishing open and affirming space for members to share loss and experience with grief, normalize grief experience and provide psycho social education and grief support.     

## 2017-01-18 NOTE — [facility] Group Notes (Signed)
LCSW Group Therapy Note  01/18/2017 1:15pm  Type of Therapy/Topic:  Group Therapy:  Balance in Life  Participation Level:  Active  Description of Group:    This group will address the concept of balance and how it feels and looks when one is unbalanced. Patients will be encouraged to process areas in their lives that are out of balance and identify reasons for remaining unbalanced. Facilitators will guide patients in utilizing problem-solving interventions to address and correct the stressor making their life unbalanced. Understanding and applying boundaries will be explored and addressed for obtaining and maintaining a balanced life. Patients will be encouraged to explore ways to assertively make their unbalanced needs known to significant others in their lives, using other group members and facilitator for support and feedback.  Therapeutic Goals: 1. Patient will identify two or more emotions or situations they have that consume much of in their lives. 2. Patient will identify signs/triggers that life has become out of balance:  3. Patient will identify two ways to set boundaries in order to achieve balance in their lives:  4. Patient will demonstrate ability to communicate their needs through discussion and/or role plays  Summary of Patient Progress: Pt was reserved in group discussion but reports that he feels his life is balanced because he is participating in volunteer activities and helping others in the community.      Therapeutic Modalities:   Cognitive Behavioral Therapy Solution-Focused Therapy Assertiveness Training  Verdene LennertLauren C Jerald Villalona, LCSW 01/18/2017 3:45 PM

## 2017-01-18 NOTE — Progress Notes (Signed)
[facility] MD Progress Note  01/18/2017 9:05 AM Billy Beard  MRN:  [id number] Subjective:  Patient states he is feeling "OK", states he feels better than prior to admission. States he had a good visit with his wife yesterday and states she is demonstrating a more positive and loving attitude towards him. ( Marital difficulties had been a major stressor prior to admission). Denies medication side effects.  Objective : I have discussed case with treatment team and have met with patient . Patient is presenting with improving mood and affect. At this time reports feeling better overall and denies any suicidal ideations and is currently future oriented, for example stating him and his wife discussed relocating to [place] so that their son, who goes to college there, can continue to live with them during his college years . Denies medication side effects. Behavior on unit is calm and in good control, pleasant on approach. Visible in day room.   Principal Problem: MDD (major depressive disorder), recurrent episode, severe ([place]) Diagnosis:   Patient Active Problem List   Diagnosis Date Noted  . MDD (major depressive disorder), recurrent episode, severe ([place]) [F33.2] 01/17/2017  . Depression, major, single episode, severe ([place]) [F32.2] 01/16/2017  . Elevated troponin [R74.8] 09/06/2016  . Diastolic dysfunction [X83.3] 08/31/2016  . Paroxysmal atrial fibrillation ([place]) [I48.0] 02/01/2016  . Hypertriglyceridemia [E78.1] 08/23/2015  . Prediabetes [R73.03] 08/23/2015  . Gout [M10.9] 08/06/2015  . HTN (hypertension) [I10] 12/09/2014  . Obesity [E66.9] 12/09/2014  . Smoker [F17.200] 12/09/2014   Total Time spent with patient: 20 minutes    Past Medical History:  Past Medical History:  Diagnosis Date  . Asthma   . Atrial fibrillation ([place])   . Gout   . Hypertension     Past Surgical History:  Procedure Laterality Date  . LEFT HEART CATH AND CORONARY ANGIOGRAPHY N/A 09/08/2016   Procedure: Left  Heart Cath and Coronary Angiography;  Surgeon: Belva Crome, MD;  Location: [facility];  Service: Cardiovascular;  Laterality: N/A;  . THYROIDECTOMY  08/2015   Patient unsure how much was removed    Family History:  Family History  Problem Relation Age of Onset  . Heart disease Mother   . Gout Father    Social History:  History  Alcohol Use  . Yes    Comment: Occasionally per patient, 550cc per patient a week     History  Drug Use No    Social History   Social History  . Marital status: Married    Spouse name: N/A  . Number of children: N/A  . Years of education: N/A   Social History Main Topics  . Smoking status: Former Smoker    Packs/day: 0.25    Years: 3.00    Types: Cigarettes  . Smokeless tobacco: Current User    Types: Chew  . Alcohol use Yes     Comment: Occasionally per patient, 550cc per patient a week  . Drug use: No  . Sexual activity: Not Asked   Other Topics Concern  . None   Social History Narrative  . None   Additional Social History:   Sleep: improved   Appetite:  Good  Current Medications: Current Facility-Administered Medications  Medication Dose Route Frequency Provider Last Rate Last Dose  . acetaminophen (TYLENOL) tablet 650 mg  650 mg Oral Q6H PRN Patriciaann Clan E, PA-C      . albuterol (PROVENTIL HFA;VENTOLIN HFA) 108 (90 Base) MCG/ACT inhaler 2 puff  2 puff Inhalation Q6H  PRN Laverle Hobby, PA-C      . allopurinol (ZYLOPRIM) tablet 100 mg  100 mg Oral Daily Laverle Hobby, PA-C   100 mg at 01/18/17 2836  . alum & mag hydroxide-simeth (MAALOX/MYLANTA) 200-200-20 MG/5ML suspension 30 mL  30 mL Oral Q4H PRN Patriciaann Clan E, PA-C      . atorvastatin (LIPITOR) tablet 40 mg  40 mg Oral Daily Laverle Hobby, PA-C   40 mg at 01/18/17 0808  . diltiazem (CARDIZEM CD) 24 hr capsule 180 mg  180 mg Oral Daily Laverle Hobby, PA-C   180 mg at 01/18/17 6294  . magnesium hydroxide (MILK OF MAGNESIA) suspension 30 mL  30 mL Oral  Daily PRN Laverle Hobby, PA-C      . metoprolol tartrate (LOPRESSOR) tablet 25 mg  25 mg Oral BID Patriciaann Clan E, PA-C   25 mg at 01/17/17 1705  . mometasone-formoterol (DULERA) 200-5 MCG/ACT inhaler 2 puff  2 puff Inhalation BID Laverle Hobby, PA-C   2 puff at 01/18/17 0817  . nicotine (NICODERM CQ - dosed in mg/24 hours) patch 21 mg  21 mg Transdermal Daily Patriciaann Clan E, PA-C   21 mg at 01/18/17 7654  . rivaroxaban (XARELTO) tablet 20 mg  20 mg Oral Q supper Laverle Hobby, PA-C   20 mg at 01/17/17 1705  . sertraline (ZOLOFT) tablet 25 mg  25 mg Oral Daily Money, Lowry Ram, FNP   25 mg at 01/18/17 6503  . traZODone (DESYREL) tablet 50 mg  50 mg Oral QHS,MR X 1 Laverle Hobby, PA-C   50 mg at 01/17/17 2153    Lab Results:  Results for orders placed or performed during the hospital encounter of 01/16/17 (from the past 48 hour(s))  Rapid urine drug screen (hospital performed)     Status: None   Collection Time: 01/16/17  6:37 PM  Result Value Ref Range   Opiates NONE DETECTED NONE DETECTED   Cocaine NONE DETECTED NONE DETECTED   Benzodiazepines NONE DETECTED NONE DETECTED   Amphetamines NONE DETECTED NONE DETECTED   Tetrahydrocannabinol NONE DETECTED NONE DETECTED   Barbiturates NONE DETECTED NONE DETECTED    Comment:        DRUG SCREEN FOR MEDICAL PURPOSES ONLY.  IF CONFIRMATION IS NEEDED FOR ANY PURPOSE, NOTIFY LAB WITHIN 5 DAYS.        LOWEST DETECTABLE LIMITS FOR URINE DRUG SCREEN Drug Class       Cutoff (ng/mL) Amphetamine      1000 Barbiturate      200 Benzodiazepine   546 Tricyclics       568 Opiates          300 Cocaine          300 THC              50   Comprehensive metabolic panel     Status: Abnormal   Collection Time: 01/16/17  7:45 PM  Result Value Ref Range   Sodium 134 (L) 135 - 145 mmol/L   Potassium 3.7 3.5 - 5.1 mmol/L   Chloride 102 101 - 111 mmol/L   CO2 22 22 - 32 mmol/L   Glucose, Bld 100 (H) 65 - 99 mg/dL   BUN 13 6 - 20 mg/dL    Creatinine, Ser 1.09 0.61 - 1.24 mg/dL   Calcium 10.5 (H) 8.9 - 10.3 mg/dL   Total Protein 7.1 6.5 - 8.1 g/dL   Albumin 3.8 3.5 - 5.0  g/dL   AST 31 15 - 41 U/L   ALT 40 17 - 63 U/L   Alkaline Phosphatase 87 38 - 126 U/L   Total Bilirubin 0.5 0.3 - 1.2 mg/dL   GFR calc non Af Amer >60 >60 mL/min   GFR calc Af Amer >60 >60 mL/min    Comment: (NOTE) The eGFR has been calculated using the CKD EPI equation. This calculation has not been validated in all clinical situations. eGFR's persistently <60 mL/min signify possible Chronic Kidney Disease.    Anion gap 10 5 - 15  Ethanol     Status: None   Collection Time: 01/16/17  7:45 PM  Result Value Ref Range   Alcohol, Ethyl (B) <5 <5 mg/dL    Comment:        LOWEST DETECTABLE LIMIT FOR SERUM ALCOHOL IS 5 mg/dL FOR MEDICAL PURPOSES ONLY   Salicylate level     Status: None   Collection Time: 01/16/17  7:45 PM  Result Value Ref Range   Salicylate Lvl <5.5 2.8 - 30.0 mg/dL  Acetaminophen level     Status: Abnormal   Collection Time: 01/16/17  7:45 PM  Result Value Ref Range   Acetaminophen (Tylenol), Serum <10 (L) 10 - 30 ug/mL    Comment:        THERAPEUTIC CONCENTRATIONS VARY SIGNIFICANTLY. A RANGE OF 10-30 ug/mL MAY BE AN EFFECTIVE CONCENTRATION FOR MANY PATIENTS. HOWEVER, SOME ARE BEST TREATED AT CONCENTRATIONS OUTSIDE THIS RANGE. ACETAMINOPHEN CONCENTRATIONS >150 ug/mL AT 4 HOURS AFTER INGESTION AND >50 ug/mL AT 12 HOURS AFTER INGESTION ARE OFTEN ASSOCIATED WITH TOXIC REACTIONS.   cbc     Status: None   Collection Time: 01/16/17  7:45 PM  Result Value Ref Range   WBC 7.7 4.0 - 10.5 K/uL   RBC 5.67 4.22 - 5.81 MIL/uL   Hemoglobin 15.4 13.0 - 17.0 g/dL   HCT 45.4 39.0 - 52.0 %   MCV 80.1 78.0 - 100.0 fL   MCH 27.2 26.0 - 34.0 pg   MCHC 33.9 30.0 - 36.0 g/dL   RDW 12.6 11.5 - 15.5 %   Platelets 213 150 - 400 K/uL    Blood Alcohol level:  Lab Results  Component Value Date   ETH <5 73/22/0254    Metabolic  Disorder Labs: Lab Results  Component Value Date   HGBA1C 6.3 (H) 09/06/2016   MPG 134 09/06/2016   MPG 128 09/09/2015   No results found for: PROLACTIN Lab Results  Component Value Date   CHOL 119 11/06/2016   TRIG 344 (H) 11/06/2016   HDL 36 (L) 11/06/2016   CHOLHDL 3.3 11/06/2016   VLDL 35 09/07/2016   LDLCALC 14 11/06/2016   LDLCALC 140 (H) 09/07/2016    Physical Findings: AIMS: Facial and Oral Movements Muscles of Facial Expression: None, normal Lips and Perioral Area: None, normal Jaw: None, normal Tongue: None, normal,Extremity Movements Upper (arms, wrists, hands, fingers): None, normal Lower (legs, knees, ankles, toes): None, normal, Trunk Movements Neck, shoulders, hips: None, normal, Overall Severity Severity of abnormal movements (highest score from questions above): None, normal Incapacitation due to abnormal movements: None, normal Patient's awareness of abnormal movements (rate only patient's report): No Awareness, Dental Status Current problems with teeth and/or dentures?: No Does patient usually wear dentures?: No  CIWA:    COWS:     Musculoskeletal: Strength & Muscle Tone: within normal limits Gait & Station: normal Patient leans: N/A  Psychiatric Specialty Exam: Physical Exam  ROS denies headache, no  shortness of breath, no chest pain, no vomiting, denies any bleeding  Blood pressure 123/74, pulse (!) 52, temperature 98.1 F (36.7 C), resp. rate 16, height 5' 1"  (1.549 m), weight 80.7 kg (178 lb), SpO2 99 %.Body mass index is 33.63 kg/m.  General Appearance: Well Groomed  Eye Contact:  Good  Speech:  Normal Rate  Volume:  Normal  Mood:  improved, states he is feeling better   Affect:  Appropriate and more reactive   Thought Process:  Linear and Descriptions of Associations: Intact  Orientation:  Other:  fully alert and attentive   Thought Content:  denies hallucinations, no delusions, not internally preoccupied   Suicidal Thoughts:  No denies  suicidal or self injurious ideations, denies homicidal or violent ideations, and specifically also denies any homicidal or violent ideations towards wife or any family member   Homicidal Thoughts:  No  Memory:  recent and rmeote groslsy intact   Judgement:  Other:  improving   Insight:  improving   Psychomotor Activity:  Normal  Concentration:  Concentration: Good and Attention Span: Good  Recall:  Good  Fund of Knowledge:  Good  Language:  Good  Akathisia:  Negative  Handed:  Right  AIMS (if indicated):     Assets:  Communication Skills Desire for Improvement Resilience  ADL's:  Intact  Cognition:  WNL  Sleep:  Number of Hours: 6.25   Assessment - patient reports he is feeling better, and currently minimizes depression or neuro-vegetative symptoms of depression. Denies suicidal ideations. Attributes improvement to improved relationship with wife, whom he states has visited , expressed concern and love for him. He is tolerating medications well thus far ( Zoloft )   Treatment Plan Summary: Daily contact with patient to assess and evaluate symptoms and progress in treatment, Medication management, Plan inpatient treatment  and medications as below Encourage group and milieu participation to work on coping skills and symptom reduction Treatment team working on discharge planning options Increase Zoloft to 50 mgrs QDAY for depression, anxiety Continue Trazodone 50 mgrs QHS PRN for insomnia as needed   Jenne Campus, MD 01/18/2017, 9:05 AM
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
## 2017-01-19 MED ORDER — SERTRALINE HCL 50 MG PO TABS: 50.0000 mg | ORAL_TABLET | Freq: Every day | ORAL | 0 refills | Status: DC

## 2017-01-19 MED ORDER — TRAZODONE HCL 50 MG PO TABS: 50.0000 mg | ORAL_TABLET | Freq: Every evening | ORAL | 0 refills | Status: DC | PRN

## 2017-01-19 NOTE — Discharge Summary (Signed)
Physician Discharge Summary Note  Patient:  Billy OlpSunil Galdamez is an 46 y.o., male MRN:  [id number] DOB:  09-21-1970 Patient phone:  [phone] (home)  Patient address:   [address] [place] [id number],  Total Time spent with patient: 30 minutes  Date of Admission:  01/17/2017 Date of Discharge: 01/19/17   Reason for Admission:  Worsening depression with SI  Principal Problem: MDD (major depressive disorder), recurrent episode, severe [facility](HCC) Discharge Diagnoses: Patient Active Problem List   Diagnosis Date Noted  . MDD (major depressive disorder), recurrent episode, severe (HCC) [F33.2] 01/17/2017  . Depression, major, single episode, severe (HCC) [F32.2] 01/16/2017  . Elevated troponin [R74.8] 09/06/2016  . Diastolic dysfunction [I51.9] 08/31/2016  . Paroxysmal atrial fibrillation (HCC) [I48.0] 02/01/2016  . Hypertriglyceridemia [E78.1] 08/23/2015  . Prediabetes [R73.03] 08/23/2015  . Gout [M10.9] 08/06/2015  . HTN (hypertension) [I10] 12/09/2014  . Obesity [E66.9] 12/09/2014  . Smoker [F17.200] 12/09/2014    Past Psychiatric History: Denies  Past Medical History:  Past Medical History:  Diagnosis Date  . Asthma   . Atrial fibrillation (HCC)   . Gout   . Hypertension     Past Surgical History:  Procedure Laterality Date  . LEFT HEART CATH AND CORONARY ANGIOGRAPHY N/A 09/08/2016   Procedure: Left Heart Cath and Coronary Angiography;  Surgeon: Lyn RecordsHenry W Smith, MD;  [facility];  Service: Cardiovascular;  Laterality: N/A;  . THYROIDECTOMY  08/2015   Patient unsure how much was removed    Family History:  Family History  Problem Relation Age of Onset  . Heart disease Mother   . Gout Father    Family Psychiatric  History: Denies Social History:  History  Alcohol Use  . Yes    Comment: Occasionally per patient, 550cc per patient a week     History  Drug Use No    Social History   Social History  . Marital status: Married    Spouse  name: N/A  . Number of children: N/A  . Years of education: N/A   Social History Main Topics  . Smoking status: Former Smoker    Packs/day: 0.25    Years: 3.00    Types: Cigarettes  . Smokeless tobacco: Current User    Types: Chew  . Alcohol use Yes     Comment: Occasionally per patient, 550cc per patient a week  . Drug use: No  . Sexual activity: Not Asked   Other Topics Concern  . None   Social History Narrative  . None    Hospital Course:   01/16/17 ED Assessment: 45 y.o.male. Patient had a appointment with [name](Eva Crow) this evening. States that he told his PCP that he was under a lot of stress. He told his doctor that he had suicidal thoughts with plan to hang himself. The [facility]CP [facility]. Patient has a lot of marital stress. He complains that his spouse will not have sex with him. The wife sleeps in the be with their daughter and refuses to lay in the bed with him. He argues with his wife tell her he has needs. States that they argued so bad over the sex issus last Thursday he called the police on her. States that when the police arrived he told them that he wanted to legally separate from her. Patient feels embarrassed about calling the police because his in-laws from [place] witnessed this domestic disputet. He says that his in-laws are  living with his family for 6 months and then returning to [place]. He further explains that his wife makes him feel bad about his eye deformity. Writer observed that patient has one eye bigger than the other. Sts, "My wife calls me a Dedda". He says, "This is a [place] word that Americans refer to as..."cross eyed". She has also asked him to get another wife. He explains that his culture having multiple wives is acceptable but he doesn't want to do this at his age.  Patient is suicidal with a plan to hang himself. He feels this way because his wife frequently says, "Go out in the back of the house and  hang yourself". He has access to means such as bed sheets and ropes. He has many trees in the back of his house. No history of suicide attempts and/or gestures. No self mutilating behaviors. No HI. He is calm and cooperative. He denies legal issues. No AVH's. He socially drinks on the weekend. No drug use. No history of INPT treatment. He does not have a outpatient mental health provider.   01/17/17: 46 year old male has been married x 22 years, lived in [place] for 10 years , has two adult children, one of them at [employer] in [place]. Employed. No legal issues. He is originally from [place]. Patient reports he had been experiencing headaches, so went to his PCP. States PCP was concerned and he told PCP he has been having increased marital discord, tension, and has been thinking of separating. He reported he was having recent suicidal ideations, with thoughts of hanging self, and was referred to ED.  He states he has been feeling depressed for several months, but feels it has worsened related to family/ marital issues . He ruminates about marital issues, states he feels wife has become distant, critical, and wanting to divorce him. He states during recent argument, " she told me to go hang myself ".  He denies any neuro-vegetative symptoms, states sleep, energy level have been "OK" Although still depressed, states he is feeling better today because he had a good conversation with his wife yesterday and feels she is more concerned and more understanding at this time. No prior psychiatric admissions, no history of prior suicidal attempts, no history of self cutting, denies history of psychosis, denies any history of violence, states he has never been on any psychiatric medications in the past . Denies alcohol or drug abuse . History of HTN, Gout, Atrial Fibrillation.  01/18/17: Subjective:  Patient states he is feeling "OK", states he feels better than prior to admission. States he had a good visit with his wife  yesterday and states she is demonstrating a more positive and loving attitude towards him. ( Marital difficulties had been a major stressor prior to admission). Denies medication side effects.  Objective :  Patient is presenting with improving mood and affect. At this time reports feeling better overall and denies any suicidal ideations and is currently future oriented, for example stating him and his wife discussed relocating to [place] so that their son, who goes to college there, can continue to live with them during his college years . Denies medication side effects. Behavior on unit is calm and in good control, pleasant on approach. Visible in day room.  Patient continued to deny any SI/HI/AVH and after being started on Zoloft and his wife agreeing to compromise in their marriage, he stabilized very quickly.Patinet contracted fr safety and was provided prescriptions for his medications. He agrees to  follow up with his outpatient providers and take his prescribed medications.   Physical Findings: AIMS: Facial and Oral Movements Muscles of Facial Expression: None, normal Lips and Perioral Area: None, normal Jaw: None, normal Tongue: None, normal,Extremity Movements Upper (arms, wrists, hands, fingers): None, normal Lower (legs, knees, ankles, toes): None, normal, Trunk Movements Neck, shoulders, hips: None, normal, Overall Severity Severity of abnormal movements (highest score from questions above): None, normal Incapacitation due to abnormal movements: None, normal Patient's awareness of abnormal movements (rate only patient's report): No Awareness, Dental Status Current problems with teeth and/or dentures?: No Does patient usually wear dentures?: No  CIWA:    COWS:     Musculoskeletal: Strength & Muscle Tone: within normal limits Gait & Station: normal Patient leans: N/A  Psychiatric Specialty Exam: Physical Exam  Nursing note and vitals reviewed. Constitutional: He is  oriented to person, place, and time. He appears well-developed and well-nourished.  Respiratory: Effort normal.  Musculoskeletal: Normal range of motion.  Neurological: He is alert and oriented to person, place, and time.  Skin: Skin is warm.    Review of Systems  Constitutional: Negative.   HENT: Negative.   Eyes: Negative.   Respiratory: Negative.   Cardiovascular: Negative.   Gastrointestinal: Negative.   Genitourinary: Negative.   Musculoskeletal: Negative.   Skin: Negative.   Neurological: Negative.   Endo/Heme/Allergies: Negative.     Blood pressure (!) 141/90, pulse (!) 58, temperature 98 F (36.7 C), resp. rate 16, height 5\' 1"  (1.549 m), weight 80.7 kg (178 lb), SpO2 99 %.Body mass index is 33.63 kg/m.  General Appearance: Casual  Eye Contact:  Good  Speech:  Clear and Coherent and Normal Rate  Volume:  Normal  Mood:  Euthymic  Affect:  Appropriate  Thought Process:  Coherent and Descriptions of Associations: Intact  Orientation:  Full (Time, Place, and Person)  Thought Content:  WDL  Suicidal Thoughts:  No  Homicidal Thoughts:  No  Memory:  Immediate;   Good Recent;   Good  Judgement:  Good  Insight:  Good  Psychomotor Activity:  Normal  Concentration:  Concentration: Good and Attention Span: Good  Recall:  Good  Fund of Knowledge:  Good  Language:  Good  Akathisia:  No  Handed:  Right  AIMS (if indicated):     Assets:  Communication Skills Desire for Improvement Financial Resources/Insurance Housing Resilience Social Support Transportation  ADL's:  Intact  Cognition:  WNL  Sleep:  Number of Hours: 6.25     Have you used any form of tobacco in the last 30 days? (Cigarettes, Smokeless Tobacco, Cigars, and/or Pipes): Yes  Has this patient used any form of tobacco in the last 30 days? (Cigarettes, Smokeless Tobacco, Cigars, and/or Pipes) Yes, Yes, A prescription for an FDA-approved tobacco cessation medication was offered at discharge and the patient  refused  Blood Alcohol level:  Lab Results  Component Value Date   ETH <5 01/16/2017    Metabolic Disorder Labs:  Lab Results  Component Value Date   HGBA1C 6.3 (H) 09/06/2016   MPG 134 09/06/2016   MPG 128 09/09/2015   No results found for: PROLACTIN Lab Results  Component Value Date   CHOL 119 11/06/2016   TRIG 344 (H) 11/06/2016   HDL 36 (L) 11/06/2016   CHOLHDL 3.3 11/06/2016   VLDL 35 09/07/2016   LDLCALC 14 11/06/2016   LDLCALC 140 (H) 09/07/2016    See Psychiatric Specialty Exam and Suicide Risk Assessment completed by Attending Physician prior  to discharge.  Discharge destination:  Home  Is patient on multiple antipsychotic therapies at discharge:  No   Has Patient had three or more failed trials of antipsychotic monotherapy by history:  No  Recommended Plan for Multiple Antipsychotic Therapies: NA   Allergies as of 01/19/2017   No Known Allergies     Medication List    TAKE these medications     Indication  albuterol 108 (90 Base) MCG/ACT inhaler Commonly known as:  PROVENTIL HFA;VENTOLIN HFA Inhale 2 puffs into the lungs every 6 (six) hours as needed for wheezing or shortness of breath.  Indication:  Asthma   allopurinol 100 MG tablet Commonly known as:  ZYLOPRIM Take 1 tablet (100 mg total) by mouth daily.  Indication:  Primary Gout   atorvastatin 40 MG tablet Commonly known as:  LIPITOR Take 1 tablet (40 mg total) by mouth daily.  Indication:  High Amount of Fats in the Blood   budesonide-formoterol 160-4.5 MCG/ACT inhaler Commonly known as:  SYMBICORT Inhale 2 puffs into the lungs 2 (two) times daily.  Indication:  Asthma   diltiazem 180 MG 24 hr capsule Commonly known as:  CARDIZEM CD Take 1 capsule (180 mg total) by mouth daily.  Indication:  High Blood Pressure Disorder   metoprolol tartrate 50 MG tablet Commonly known as:  LOPRESSOR Take 0.5 tablets (25 mg total) by mouth 2 (two) times daily.  Indication:  High Blood Pressure  Disorder   rivaroxaban 20 MG Tabs tablet Commonly known as:  XARELTO Take 1 tablet (20 mg total) by mouth daily with supper.  Indication:  Afib   sertraline 50 MG tablet Commonly known as:  ZOLOFT Take 1 tablet (50 mg total) by mouth daily. For mood control  Indication:  Major Depressive Disorder   traZODone 50 MG tablet Commonly known as:  DESYREL Take 1 tablet (50 mg total) by mouth at bedtime as needed for sleep.  Indication:  Trouble Sleeping      Follow-up Information    [facility] Follow up on 01/23/2017.   Why:  Hospital discharge follow up w your PCP on Sept 4th at 9:40 AM.  Bring hospital discharge paperwork.  Call to cancel/reschedule if needed.  Contact information: [address] [place] [address] [phone]       [facility] Follow up.   Why:  Inital appointment for therapy on 9/10 at 3 PM w Vallery Sa. Please call to cancel/reschedule if needed.  Bring photo ID and copy of insurance card.  Call to cancel/reschedule if needed.  Contact information: [address]  [place] [address] Phone:  [phone] Fax:  [phone]          Follow-up recommendations:  Continue activity as tolerated. Continue diet as recommended by your PCP. Ensure to keep all appointments with outpatient providers.  Comments:  Patient is instructed prior to discharge to: Take all medications as prescribed by his/her mental healthcare provider. Report any adverse effects and or reactions from the medicines to his/her outpatient provider promptly. Patient has been instructed & cautioned: To not engage in alcohol and or illegal drug use while on prescription medicines. In the event of worsening symptoms, patient is instructed to call the crisis hotline, 911 and or go to the nearest ED for appropriate evaluation and treatment of symptoms. To follow-up with his/her primary care provider for  your other medical issues, concerns and or health care needs.  Signed: Gerlene Burdock Money, FNP 01/19/2017, 8:38 AM  Patient seen, Suicide Assessment Completed.  Disposition Plan Reviewed

## 2017-01-19 NOTE — Progress Notes (Signed)
Pt reports this has been a good day.  He had a visit with his wife this evening which he said was good.  He is looking to be discharged home tomorrow.  He denies SI/HI/AVH.  He voices no needs or concerns at this time.  He attended evening group, and spent the rest of the evening in the dayroom watching TV.  Support and encouragement offered.  Discharge plans are in process.  Pt plans to return home at discharge.  Safety maintained with q15 minute checks.

## 2017-01-19 NOTE — Tx Team (Signed)
Interdisciplinary Treatment and Diagnostic Plan Update  01/19/2017 Time of Session: 9:30am Billy Beard MRN: [id number]  Principal Diagnosis: MDD (major depressive disorder), recurrent episode, severe (HCC)  Secondary Diagnoses: Principal Problem:   MDD (major depressive disorder), recurrent episode, severe (HCC)   Current Medications:  Current Facility-Administered Medications  Medication Dose Route Frequency Provider Last Rate Last Dose  . acetaminophen (TYLENOL) tablet 650 mg  650 mg Oral Q6H PRN Donell Sievert E, PA-C      . albuterol (PROVENTIL HFA;VENTOLIN HFA) 108 (90 Base) MCG/ACT inhaler 2 puff  2 puff Inhalation Q6H PRN Kerry Hough, PA-C      . allopurinol (ZYLOPRIM) tablet 100 mg  100 mg Oral Daily Donell Sievert E, PA-C   100 mg at 01/19/17 0755  . alum & mag hydroxide-simeth (MAALOX/MYLANTA) 200-200-20 MG/5ML suspension 30 mL  30 mL Oral Q4H PRN Donell Sievert E, PA-C      . atorvastatin (LIPITOR) tablet 40 mg  40 mg Oral Daily Donell Sievert E, PA-C   40 mg at 01/19/17 0756  . diltiazem (CARDIZEM CD) 24 hr capsule 180 mg  180 mg Oral Daily Kerry Hough, PA-C   180 mg at 01/19/17 0756  . magnesium hydroxide (MILK OF MAGNESIA) suspension 30 mL  30 mL Oral Daily PRN Kerry Hough, PA-C      . metoprolol tartrate (LOPRESSOR) tablet 25 mg  25 mg Oral BID Kerry Hough, PA-C   25 mg at 01/19/17 0756  . mometasone-formoterol (DULERA) 200-5 MCG/ACT inhaler 2 puff  2 puff Inhalation BID Kerry Hough, PA-C   2 puff at 01/19/17 0757  . nicotine (NICODERM CQ - dosed in mg/24 hours) patch 21 mg  21 mg Transdermal Daily Donell Sievert E, PA-C   21 mg at 01/19/17 0755  . rivaroxaban (XARELTO) tablet 20 mg  20 mg Oral Q supper Kerry Hough, PA-C   20 mg at 01/18/17 1713  . sertraline (ZOLOFT) tablet 50 mg  50 mg Oral Daily Cobos, Rockey Situ, MD   50 mg at 01/19/17 0756  . traZODone (DESYREL) tablet 50 mg  50 mg Oral QHS,MR X 1 Kerry Hough, PA-C   50 mg at 01/18/17  2118    PTA Medications: Prescriptions Prior to Admission  Medication Sig Dispense Refill Last Dose  . albuterol (PROVENTIL HFA;VENTOLIN HFA) 108 (90 Base) MCG/ACT inhaler Inhale 2 puffs into the lungs every 6 (six) hours as needed for wheezing or shortness of breath. 1 Inhaler 0 Past Month at Unknown time  . allopurinol (ZYLOPRIM) 100 MG tablet Take 1 tablet (100 mg total) by mouth daily. 90 tablet 3 01/16/2017 at Unknown time  . atorvastatin (LIPITOR) 40 MG tablet Take 1 tablet (40 mg total) by mouth daily. 90 tablet 3 01/16/2017 at Unknown time  . budesonide-formoterol (SYMBICORT) 160-4.5 MCG/ACT inhaler Inhale 2 puffs into the lungs 2 (two) times daily. 1 Inhaler 0 01/16/2017 at Unknown time  . diltiazem (CARDIZEM CD) 180 MG 24 hr capsule Take 1 capsule (180 mg total) by mouth daily. 90 capsule 0 01/16/2017 at Unknown time  . metoprolol tartrate (LOPRESSOR) 50 MG tablet Take 0.5 tablets (25 mg total) by mouth 2 (two) times daily. 180 tablet 3 01/16/2017 at 1730  . rivaroxaban (XARELTO) 20 MG TABS tablet Take 1 tablet (20 mg total) by mouth daily with supper. 90 tablet 3 01/16/2017 at 1730    Treatment Modalities: Medication Management, Group therapy, Case management,  1 to 1 session with clinician, Psychoeducation,  Recreational therapy.  Patient Stressors: Marital or family conflict  Patient Strengths: Ability for insight Active sense of humor Capable of independent living Licensed conveyancer  Physician Treatment Plan for Primary Diagnosis: MDD (major depressive disorder), recurrent episode, severe (HCC) Long Term Goal(s): Improvement in symptoms so as ready for discharge  Short Term Goals: Ability to verbalize feelings will improve Ability to disclose and discuss suicidal ideas Ability to maintain clinical measurements within normal limits will improve Compliance with prescribed medications will improve  Medication Management: Evaluate patient's response, side  effects, and tolerance of medication regimen.  Therapeutic Interventions: 1 to 1 sessions, Unit Group sessions and Medication administration.  Evaluation of Outcomes: Adequate for Discharge  Physician Treatment Plan for Secondary Diagnosis: Principal Problem:   MDD (major depressive disorder), recurrent episode, severe (HCC)   Long Term Goal(s): Improvement in symptoms so as ready for discharge  Short Term Goals: Ability to verbalize feelings will improve Ability to disclose and discuss suicidal ideas Ability to maintain clinical measurements within normal limits will improve Compliance with prescribed medications will improve  Medication Management: Evaluate patient's response, side effects, and tolerance of medication regimen.  Therapeutic Interventions: 1 to 1 sessions, Unit Group sessions and Medication administration.  Evaluation of Outcomes: Adequate for Discharge   RN Treatment Plan for Primary Diagnosis: MDD (major depressive disorder), recurrent episode, severe (HCC) Long Term Goal(s): Knowledge of disease and therapeutic regimen to maintain health will improve  Short Term Goals: Ability to disclose and discuss suicidal ideas and Compliance with prescribed medications will improve  Medication Management: RN will administer medications as ordered by provider, will assess and evaluate patient's response and provide education to patient for prescribed medication. RN will report any adverse and/or side effects to prescribing provider.  Therapeutic Interventions: 1 on 1 counseling sessions, Psychoeducation, Medication administration, Evaluate responses to treatment, Monitor vital signs and CBGs as ordered, Perform/monitor CIWA, COWS, AIMS and Fall Risk screenings as ordered, Perform wound care treatments as ordered.  Evaluation of Outcomes: Adequate for Discharge   LCSW Treatment Plan for Primary Diagnosis: MDD (major depressive disorder), recurrent episode, severe (HCC) Long  Term Goal(s): Safe transition to appropriate next level of care at discharge, Engage patient in therapeutic group addressing interpersonal concerns.  Short Term Goals: Engage patient in aftercare planning with referrals and resources, Identify triggers associated with mental health/substance abuse issues and Increase skills for wellness and recovery  Therapeutic Interventions: Assess for all discharge needs, 1 to 1 time with Social worker, Explore available resources and support systems, Assess for adequacy in community support network, Educate family and significant other(s) on suicide prevention, Complete Psychosocial Assessment, Interpersonal group therapy.  Evaluation of Outcomes: Adequate for Discharge   Progress in Treatment: Attending groups: Yes  Participating in groups: Yes Taking medication as prescribed: Yes, MD continues to assess for medication changes as needed Toleration medication: Yes, no side effects reported at this time Family/Significant other contact made: No, CSW made two unsuccessful attempts to make contact with wife Patient understands diagnosis: Yes  Discussing patient identified problems/goals with staff: Yes Medical problems stabilized or resolved: Yes Denies suicidal/homicidal ideation: Yes Issues/concerns per patient self-inventory: None Other: N/A  New problem(s) identified: None identified at this time.   New Short Term/Long Term Goal(s): "get counseling set up"  Discharge Plan or Barriers: Pt will return home and follow-up with outpatient services at his PCP and with [facility]  Reason for Continuation of Hospitalization: None identified at this time.  Estimated Length of Stay: 0 days; Pt stable for DC today  Attendees: Patient: Billy OlpSunil Stemler 01/19/2017  12:13 PM  Physician: Dr. Jama Flavorsobos, MD 01/19/2017  12:13 PM  Nursing: Marlyn CorporalPatricia Duke, RN 01/19/2017  12:13 PM  RN Care Manager:  01/19/2017  12:13 PM  Social Worker: Vernie ShanksLauren Sherlie Boyum,  LCSW 01/19/2017  12:13 PM  Recreational Therapist:  01/19/2017  12:13 PM  Other: 01/19/2017  12:13 PM  Other:  01/19/2017  12:13 PM  Other: 01/19/2017  12:13 PM    Scribe for Treatment Team: Verdene LennertLauren C Ka Flammer, LCSW 01/19/2017 12:13 PM

## 2017-01-19 NOTE — Progress Notes (Signed)
Recreation Therapy Notes  Date: 01/19/17 Time: 0930 Location: 500 Hall Dayroom  Group Topic: Stress Management  Goal Area(s) Addresses:  Patient will verbalize importance of using healthy stress management.  Patient will identify positive emotions associated with healthy stress management.   Intervention: Stress Management  Activity :  Progressive Muscle Relaxation.  LRT introduced the stress management technique of progressive muscle relaxation.  Patients were to follow along as LRT read script to fully engage in the technique.  Education:  Stress Management, Discharge Planning.   Education Outcome: Acknowledges edcuation/In group clarification offered/Needs additional education  Clinical Observations/Feedback: Pt did not attend group.    Caroll RancherMarjette Shalita Notte, LRT/CTRS       Caroll RancherLindsay, Karli Wickizer A 01/19/2017 12:11 PM

## 2017-01-19 NOTE — Progress Notes (Signed)
D Pt is OOB UAL on the 400 hall this am.He makes good eye contact with this Clinical research associatewriter. He is pleasant and cooperative, he completes his daily assessment and on this he writes he denies SI this morning and he rates his depression, hopelessness and anxiety " 0/0/0/", respectively. DC teaching is done with pt per poc and pt reports verbal understanding. Pt given cc of dc instructions ( SRA, AVS, SSP and transition record). Pt given all belongings in locker and the escorted to bldg entrance.

## 2017-01-19 NOTE — [facility] Suicide Risk Assessment (Signed)
[facility]   Principal Problem: MDD (major depressive disorder), recurrent episode, severe (HCC) Discharge Diagnoses:  Patient Active Problem List   Diagnosis Date Noted  . MDD (major depressive disorder), recurrent episode, severe (HCC) [F33.2] 01/17/2017  . Depression, major, single episode, severe (HCC) [F32.2] 01/16/2017  . Elevated troponin [R74.8] 09/06/2016  . Diastolic dysfunction [I51.9] 08/31/2016  . Paroxysmal atrial fibrillation (HCC) [I48.0] 02/01/2016  . Hypertriglyceridemia [E78.1] 08/23/2015  . Prediabetes [R73.03] 08/23/2015  . Gout [M10.9] 08/06/2015  . HTN (hypertension) [I10] 12/09/2014  . Obesity [E66.9] 12/09/2014  . Smoker [F17.200] 12/09/2014    Total Time spent with patient: 30 minutes  Musculoskeletal: Strength & Muscle Tone: within normal limits Gait & Station: normal Patient leans: N/A  Psychiatric Specialty Exam: ROS denies headache, denies chest pain, no shortness of breath, no vomiting   Blood pressure (!) 149/91, pulse (!) 55, temperature 98.5 F (36.9 C), temperature source Oral, resp. rate 16, height 5\' 1"  (1.549 m), weight 80.7 kg (178 lb), SpO2 99 %.Body mass index is 33.63 kg/m.  General Appearance: Well Groomed  Eye Contact::  Good  Speech:  Normal Rate409  Volume:  Normal  Mood:  improved  Affect:  appropriate, more reactive  Thought Process:  Linear and Descriptions of Associations: Intact  Orientation:  Full (Time, Place, and Person)  Thought Content:  no hallucinations,no delusions, not internally preoccupied, less focused on marital stressors, which he states are improved at this time  Suicidal Thoughts:  No denies suicidal ideations, denies self injurious ideations, denies any homicidal or violent ideations, and specifically has denied any violent ideations towards wife or family   Homicidal Thoughts:  No  Memory:  recent and remote grossly intact   Judgement:  Other:  improving   Insight:  improving   Psychomotor Activity:  Normal  Concentration:  Good  Recall:  Good  Fund of Knowledge:Good  Language: Good  Akathisia:  Negative  Handed:  Right  AIMS (if indicated):     Assets:  Communication Skills  Sleep:  Number of Hours: 6.25  Cognition: WNL  ADL's:  Intact   Mental Status Per Nursing Assessment::   On Admission:  Suicidal ideation indicated by patient  Demographic Factors:  46 year old married male , employed  Loss Factors: Marital conflict   Historical Factors: No history of prior psychiatric admissions, denies prior history of suicide attempts , no history of self cutting   Risk Reduction Factors:   Sense of responsibility to family, Positive social support and Positive coping skills or problem solving skills  Continued Clinical Symptoms:  At this time patient presents alert and attentive, well related, pleasant, describes mood as improved, affect is fuller in range, at this time denies feeling depressed. No thought disorder, no suicidal ideations, no homicidal ideations, no psychotic symptoms, future oriented . For example, states his wife and him are thinking of relocating to [place] to be closer to their son who is currently in college there. Looking forward to a family trip to [place]  this weekend . States that his relationship with his wife is now much improved  Denies medication side effects Presents calm, pleasant on approach  Cognitive Features That Contribute To Risk:  No gross cognitive deficits noted upon discharge. Is alert , attentive, and oriented x 3   Suicide Risk:  Mild:  Suicidal ideation of limited frequency, intensity, duration, and specificity.  There are no identifiable plans, no associated intent, mild dysphoria and related symptoms, good self-control (both  objective and subjective assessment), few other risk factors, and identifiable protective factors, including available and accessible social support.  Follow-up Information    [facility]  [facility] [place] Follow up on 01/23/2017.   Why:  Hospital discharge follow up w your PCP on Sept 4th at 9:40 AM.  Bring hospital discharge paperwork.  Call to cancel/reschedule if needed.  Contact information: [address] [place] [address] [phone]       [facility] Follow up.   Why:  Inital appointment for therapy on 9/10 at 3 PM w Vallery Sa. Please call to cancel/reschedule if needed.  Bring photo ID and copy of insurance card.  Call to cancel/reschedule if needed.  Contact information: [address]  [place] [address] Phone:  [phone] Fax:  [phone]          Plan Of Care/Follow-up recommendations:  Activity:  as tolerated  Diet:  Heart Healthy Tests:  NA Other:  See below Patient is reporting improvement, readiness for discharge, and is looking forward to discharge today States he plans to return home , wife will pick him up later today Plans to follow up as above - has established PCP for management of medical issues as needed   Craige Cotta, MD 01/19/2017, 8:33 AM

## 2017-01-19 NOTE — Progress Notes (Signed)
  [facility] :  Will you be returning to the same living situation after discharge:  Yes,  Pt returning home with family At discharge, do you have transportation home?: Yes,  Pt wife to pick up Do you have the ability to pay for your medications: Yes,  Pt provided with prescriptions  Release of information consent forms completed and in the chart;  Patient's signature needed at discharge.  Patient to Follow up at: Follow-up Information    [facility] [place] Follow up on 01/23/2017.   Why:  Hospital discharge follow up w your PCP on Sept 4th at 9:40 AM.  Bring hospital discharge paperwork.  Call to cancel/reschedule if needed.  Contact information: [address] [place] [id number] [phone]       [facility] Follow up.   Why:  Inital appointment for therapy on 9/10 at 3 PM w Vallery SaValencia Sanders Davis. Please call to cancel/reschedule if needed.  Bring photo ID and copy of insurance card.  Call to cancel/reschedule if needed.  Contact information: [address]  [place] [address] Phone:  [phone] Fax:  [phone]          Next level of care provider has access to [facility] Link:no  Safety Planning and Suicide Prevention discussed: Yes,  with Pt; 2 unsuccessful attempts with wife  Have you used any form of tobacco in the last 30 days? (Cigarettes, Smokeless Tobacco, Cigars, and/or Pipes): Yes  Has patient been referred to the Quitline?: Patient refused referral  Patient has been referred for addiction treatment: N/A  Verdene LennertLauren C Tambria Pfannenstiel, LCSW 01/19/2017, 8:46 AM

## 2017-01-23 ENCOUNTER — Inpatient Hospital Stay: Payer: Self-pay | Admitting: Family Medicine

## 2017-01-23 ENCOUNTER — Inpatient Hospital Stay: Payer: Self-pay

## 2017-01-24 ENCOUNTER — Ambulatory Visit (INDEPENDENT_AMBULATORY_CARE_PROVIDER_SITE_OTHER): Payer: BLUE CROSS/BLUE SHIELD | Admitting: Family Medicine

## 2017-01-24 VITALS — BP 144/90 | HR 86 | Temp 98.2°F | Ht 63.0 in | Wt 181.1 lb

## 2017-01-24 DIAGNOSIS — I10 Essential (primary) hypertension: Secondary | ICD-10-CM

## 2017-01-24 DIAGNOSIS — F322 Major depressive disorder, single episode, severe without psychotic features: Secondary | ICD-10-CM

## 2017-01-24 MED ORDER — DILTIAZEM HCL ER COATED BEADS 180 MG PO CP24: 180.0000 mg | ORAL_CAPSULE | Freq: Every day | ORAL | 1 refills | Status: DC

## 2017-01-24 NOTE — Patient Instructions (Signed)
Thank you for coming in today. Attend therapy as scheduled by the hospital.  Recheck with me in 1 week..  We will likely increase sertraline at that point.  Continue other medicines.  Return sooner if needed.  Let me know if you feel like hurting yourself or others.   Call or go to the emergency room if you get worse, have trouble breathing, have chest pains, or palpitations.

## 2017-01-24 NOTE — Progress Notes (Signed)
Billy OlpSunil Beard is a 46 y.o. male who [facility] today for follow-up depression. Patient was seen last week with an episode of depression with severe suicidal ideation with plan. He was admitted to the behavioral hospital for 3 days. He was discharged with trazodone and citalopram. He is taking the 50 mg of citalopram daily and notes that it seems to be helping him feel a bit better. He is tolerating the medication well. He has not had to use trazodone yet for insomnia. He no longer is feeling suicidal but is not yet fully better. He notes that he has a follow-up appointment scheduled with licensed clinical social worker in the near future.  Hypertension: Patient continues to take metoprolol and diltiazem for hypertension and atrial fibrillation. He tolerates his medications well with no chest pain palpitations or shortness of breath.   Past Medical History:  Diagnosis Date  . Asthma   . Atrial fibrillation (HCC)   . Gout   . Hypertension    Past Surgical History:  Procedure Laterality Date  . LEFT HEART CATH AND CORONARY ANGIOGRAPHY N/A 09/08/2016   Procedure: Left Heart Cath and Coronary Angiography;  Surgeon: Lyn RecordsHenry W Smith, MD;  [facility];  Service: Cardiovascular;  Laterality: N/A;  . THYROIDECTOMY  08/2015   Patient unsure how much was removed    Social History  Substance Use Topics  . Smoking status: Former Smoker    Packs/day: 0.25    Years: 3.00    Types: Cigarettes  . Smokeless tobacco: Current User    Types: Chew  . Alcohol use Yes     Comment: Occasionally per patient, 550cc per patient a week   family history includes Gout in his father; Heart disease in his mother.  ROS as above:  Medications: Current Outpatient Prescriptions  Medication Sig Dispense Refill  . albuterol (PROVENTIL HFA;VENTOLIN HFA) 108 (90 Base)  MCG/ACT inhaler Inhale 2 puffs into the lungs every 6 (six) hours as needed for wheezing or shortness of breath. 1 Inhaler 0  . allopurinol (ZYLOPRIM) 100 MG tablet Take 1 tablet (100 mg total) by mouth daily. 90 tablet 3  . atorvastatin (LIPITOR) 40 MG tablet Take 1 tablet (40 mg total) by mouth daily. 90 tablet 3  . budesonide-formoterol (SYMBICORT) 160-4.5 MCG/ACT inhaler Inhale 2 puffs into the lungs 2 (two) times daily. 1 Inhaler 0  . diltiazem (CARDIZEM CD) 180 MG 24 hr capsule Take 1 capsule (180 mg total) by mouth daily. 90 capsule 0  . metoprolol tartrate (LOPRESSOR) 50 MG tablet Take 0.5 tablets (25 mg total) by mouth 2 (two) times daily. 180 tablet 3  . rivaroxaban (XARELTO) 20 MG TABS tablet Take 1 tablet (20 mg total) by mouth daily with supper. 90 tablet 3  . sertraline (ZOLOFT) 50 MG tablet Take 1 tablet (50 mg total) by mouth daily. For mood control 30 tablet 0  . traZODone (DESYREL) 50 MG tablet Take 1 tablet (50 mg total) by mouth at bedtime as needed for sleep. 30 tablet 0   No current facility-administered medications for this visit.    No Known Allergies  Health Maintenance Health Maintenance  Topic Date Due  . INFLUENZA VACCINE  12/20/2016  . TETANUS/TDAP  05/22/2017  . HIV Screening  Completed     Exam:  BP (!) 144/90   Pulse 86   Temp 98.2 F (36.8 C)   Ht 5\' 3"  (1.6 m)  Wt 181 lb 1.6 oz (82.1 kg)   SpO2 98%   BMI 32.08 kg/m  Gen: Well NAD HEENT: EOMI,  MMM Lungs: Normal work of breathing. CTABL Heart: RRR no MRG Abd: NABS, Soft. Nondistended, Nontender Exts: Brisk capillary refill, warm and well perfused.  Psych alert and oriented normal speech thought process and affect. No SI or HI expressed.  No results found for this or any previous visit (from the past 72 hour(s)). No results found.    Assessment and Plan: 46 y.o. male with  Depression: Improved but not yet fully better. Plan to continue sertraline. Follow up with therapy and recheck in 1  week. One week will likely increase sertraline to 100 mg. Discuss suicidal precautions.  Blood pressure: Still a bit elevated but improved. Plan to watchful wait and follow-up in a week. If not better well add more medication or increased doses.   No orders of the defined types were placed in this encounter.  No orders of the defined types were placed in this encounter.    Discussed warning signs or symptoms. Please see discharge instructions. Patient expresses understanding.

## 2017-02-01 ENCOUNTER — Ambulatory Visit (INDEPENDENT_AMBULATORY_CARE_PROVIDER_SITE_OTHER): Payer: BLUE CROSS/BLUE SHIELD | Admitting: Family Medicine

## 2017-02-01 VITALS — BP 125/77 | HR 66

## 2017-02-01 DIAGNOSIS — F321 Major depressive disorder, single episode, moderate: Secondary | ICD-10-CM | POA: Diagnosis not present

## 2017-02-01 MED ORDER — SERTRALINE HCL 100 MG PO TABS: 100.0000 mg | ORAL_TABLET | Freq: Every day | ORAL | 3 refills | Status: DC

## 2017-02-01 MED ORDER — TRAZODONE HCL 50 MG PO TABS: 50.0000 mg | ORAL_TABLET | Freq: Every evening | ORAL | 0 refills | Status: DC | PRN

## 2017-02-01 NOTE — Patient Instructions (Signed)
Thank you for coming in today. Increase the Sertraline from  to  daily starting in a few days.  Use trazodone at bedtime as needed for sleep.   Recheck with me in 1 month or sooner if needed.   Please attend Therapy.

## 2017-02-01 NOTE — Progress Notes (Signed)
Billy Beard is a 46 y.o. male who presents [name] today for follow-up mood. Senilis seen previously for depression with suicidality. In the interim he is been started on Zoloft and currently takes 50 mg of Zoloft daily and 50 mg of trazodone at bedtime for insomnia. This has helped significantly and he feels a lot better. His any SI or HI today. He is scheduled for counseling to start in the near future.   Past Medical History:  Diagnosis Date  . Asthma   . Atrial fibrillation (HCC)   . Gout   . Hypertension    Past Surgical History:  Procedure Laterality Date  . LEFT HEART CATH AND CORONARY ANGIOGRAPHY N/A 09/08/2016   Procedure: Left Heart Cath and Coronary Angiography;  Surgeon: Lyn Records, MD;  Location: [facility] INVASIVE CV LAB;  Service: Cardiovascular;  Laterality: N/A;  . THYROIDECTOMY  08/2015   Patient unsure how much was removed    Social History  Substance Use Topics  . Smoking status: Former Smoker    Packs/day: 0.25    Years: 3.00    Types: Cigarettes  . Smokeless tobacco: Current User    Types: Chew  . Alcohol use Yes     Comment: Occasionally per patient, 550cc per patient a week   family history includes Gout in his father; Heart disease in his mother.  ROS as above:  Medications: Current Outpatient Prescriptions  Medication Sig Dispense Refill  . albuterol (PROVENTIL HFA;VENTOLIN HFA) 108 (90 Base) MCG/ACT inhaler Inhale 2 puffs into the lungs every 6 (six) hours as needed for wheezing or shortness of breath. 1 Inhaler 0  . allopurinol (ZYLOPRIM) 100 MG tablet Take 1 tablet (100 mg total) by mouth daily. 90 tablet 3  . atorvastatin (LIPITOR) 40 MG tablet Take 1 tablet (40 mg total) by mouth daily. 90 tablet 3  . budesonide-formoterol (SYMBICORT) 160-4.5 MCG/ACT inhaler Inhale 2 puffs into the lungs 2 (two) times daily. 1 Inhaler 0    . diltiazem (CARDIZEM CD) 180 MG 24 hr capsule Take 1 capsule (180 mg total) by mouth daily. 90 capsule 1  . metoprolol tartrate (LOPRESSOR) 50 MG tablet Take 0.5 tablets (25 mg total) by mouth 2 (two) times daily. 180 tablet 3  . rivaroxaban (XARELTO) 20 MG TABS tablet Take 1 tablet (20 mg total) by mouth daily with supper. 90 tablet 3  . sertraline (ZOLOFT) 50 MG tablet Take 1 tablet (50 mg total) by mouth daily. For mood control 30 tablet 0  . traZODone (DESYREL) 50 MG tablet Take 1 tablet (50 mg total) by mouth at bedtime as needed for sleep. 30 tablet 0   No current facility-administered medications for this visit.    No Known Allergies  Health Maintenance Health Maintenance  Topic Date Due  . INFLUENZA VACCINE  12/20/2016  . TETANUS/TDAP  05/22/2017  . HIV Screening  Completed     Exam:  BP 125/77   Pulse 66  Gen: Well NAD HEENT: EOMI,  MMM Lungs: Normal work of breathing. CTABL Heart: RRR no MRG Abd: NABS, Soft. Nondistended, Nontender Exts: Brisk capillary refill, warm and well perfused.  Psych alert and oriented normal speech some process and affect. No SI or HI expressed.  Depression screen [facility] 2/9 02/01/2017 01/16/2017  Decreased Interest 3 3  Down, Depressed, Hopeless 2 3  PHQ - 2 Score 5 6  Altered sleeping 1 3  Tired, decreased energy 1 0  Change in appetite 0 0  Feeling bad or failure about yourself  1 3  Trouble concentrating 0 0  Moving slowly or fidgety/restless 0 1  Suicidal thoughts 0 3  PHQ-9 Score 8 16      No results found for this or any previous visit (from the past 72 hour(s)). No results found.    Assessment and Plan: 46 y.o. male with Depression: Improving. Increase Zoloft to 100 mg daily. Continue trazodone 50 mg at night. Recheck in one month. Attended therapy.   No orders of the defined types were placed in this encounter.  No orders of the defined types were placed in this encounter.    Discussed warning signs or symptoms.  Please see discharge instructions. Patient expresses understanding.

## 2017-07-28 ENCOUNTER — Other Ambulatory Visit: Payer: Self-pay | Admitting: Family Medicine

## 2017-09-06 ENCOUNTER — Telehealth: Payer: Self-pay

## 2017-09-06 MED ORDER — ALBUTEROL SULFATE HFA 108 (90 BASE) MCG/ACT IN AERS: 2.0000 | INHALATION_SPRAY | Freq: Four times a day (QID) | RESPIRATORY_TRACT | 0 refills | Status: DC | PRN

## 2017-09-06 NOTE — Telephone Encounter (Signed)
Albuterol refilled.  Follow up with me on Monday

## 2017-09-06 NOTE — Telephone Encounter (Signed)
Guido called for a refill on an inhaler. He states she has a cough and some allergy problems. Denies fever, chills or sweats. Last prescribed by a hospital doctor. I did schedule him for Monday. Please advise.

## 2017-09-06 NOTE — Telephone Encounter (Signed)
Unable to leave a message.

## 2017-09-10 ENCOUNTER — Other Ambulatory Visit: Payer: Self-pay

## 2017-09-10 ENCOUNTER — Ambulatory Visit (INDEPENDENT_AMBULATORY_CARE_PROVIDER_SITE_OTHER): Payer: BLUE CROSS/BLUE SHIELD | Admitting: Family Medicine

## 2017-09-10 ENCOUNTER — Encounter: Payer: Self-pay | Admitting: Family Medicine

## 2017-09-10 VITALS — BP 121/81 | HR 50 | Temp 98.3°F | Resp 18 | Wt 180.0 lb

## 2017-09-10 DIAGNOSIS — I480 Paroxysmal atrial fibrillation: Secondary | ICD-10-CM

## 2017-09-10 DIAGNOSIS — J4530 Mild persistent asthma, uncomplicated: Secondary | ICD-10-CM | POA: Diagnosis not present

## 2017-09-10 DIAGNOSIS — Z23 Encounter for immunization: Secondary | ICD-10-CM

## 2017-09-10 DIAGNOSIS — E781 Pure hyperglyceridemia: Secondary | ICD-10-CM

## 2017-09-10 DIAGNOSIS — R7303 Prediabetes: Secondary | ICD-10-CM

## 2017-09-10 DIAGNOSIS — Z6831 Body mass index (BMI) 31.0-31.9, adult: Secondary | ICD-10-CM | POA: Diagnosis not present

## 2017-09-10 DIAGNOSIS — M1A9XX0 Chronic gout, unspecified, without tophus (tophi): Secondary | ICD-10-CM

## 2017-09-10 DIAGNOSIS — E6609 Other obesity due to excess calories: Secondary | ICD-10-CM | POA: Diagnosis not present

## 2017-09-10 DIAGNOSIS — F325 Major depressive disorder, single episode, in full remission: Secondary | ICD-10-CM | POA: Diagnosis not present

## 2017-09-10 DIAGNOSIS — I10 Essential (primary) hypertension: Secondary | ICD-10-CM

## 2017-09-10 MED ORDER — DILTIAZEM HCL ER COATED BEADS 180 MG PO CP24: 180.0000 mg | ORAL_CAPSULE | Freq: Every day | ORAL | 2 refills | Status: DC

## 2017-09-10 MED ORDER — RIVAROXABAN 20 MG PO TABS: 20.0000 mg | ORAL_TABLET | Freq: Every day | ORAL | 3 refills | Status: AC

## 2017-09-10 MED ORDER — BUDESONIDE-FORMOTEROL FUMARATE 160-4.5 MCG/ACT IN AERO: 2.0000 | INHALATION_SPRAY | Freq: Two times a day (BID) | RESPIRATORY_TRACT | 4 refills | Status: DC

## 2017-09-10 NOTE — Progress Notes (Signed)
Billy Beard is a 47 y.o. male who presents to [name] today for asthma.   Billy Beard has a history of mild intermittent asthma typically managed with no treatment.  He has an albuterol inhaler which she rarely uses typically.  However he notes recently he is having worsening problems breathing.  He notes over the past few weeks seasonal allergy symptoms associated with wheezing and shortness of breath.  He is using his albuterol twice daily.  In the past he does use Symbicort but no longer has it.  He is not currently taking any other allergies or asthma medications.  Atrial fibrillation currently taking metoprolol, diltiazem, and Xarelto daily.  No chest pain palpitations or shortness of breath.  Last appointment with cardiology was July 2018.  Gout: Patient takes allopurinol daily and denies any gout flares.  Depression: Patient had a history of severe depression and is currently asymptomatic with no current medications.   Past Medical History:  Diagnosis Date  . Asthma   . Atrial fibrillation (HCC)   . Gout   . Hypertension    Past Surgical History:  Procedure Laterality Date  . LEFT HEART CATH AND CORONARY ANGIOGRAPHY N/A 09/08/2016   Procedure: Left Heart Cath and Coronary Angiography;  Surgeon: Lyn Records, MD;  Location: [facility] CV LAB;  Service: Cardiovascular;  Laterality: N/A;  . THYROIDECTOMY  08/2015   Patient unsure how much was removed    Social History   Tobacco Use  . Smoking status: Former Smoker    Packs/day: 0.25    Years: 3.00    Pack years: 0.75    Types: Cigarettes  . Smokeless tobacco: Current User    Types: Chew  Substance Use Topics  . Alcohol use: Yes    Comment: Occasionally per patient, 550cc per patient a week   family history includes Gout in his father; Heart disease in his mother.  ROS as  above:  Medications: Current Outpatient Medications  Medication Sig Dispense Refill  . albuterol (PROVENTIL HFA;VENTOLIN HFA) 108 (90 Base) MCG/ACT inhaler Inhale 2 puffs into the lungs every 6 (six) hours as needed for wheezing or shortness of breath. 1 Inhaler 0  . allopurinol (ZYLOPRIM) 100 MG tablet Take 1 tablet (100 mg total) by mouth daily. 90 tablet 3  . atorvastatin (LIPITOR) 40 MG tablet Take 1 tablet (40 mg total) by mouth daily. 90 tablet 3  . budesonide-formoterol (SYMBICORT) 160-4.5 MCG/ACT inhaler Inhale 2 puffs into the lungs 2 (two) times daily. 1 Inhaler 4  . diltiazem (CARTIA XT) 180 MG 24 hr capsule Take 1 capsule (180 mg total) by mouth daily. 90 capsule 2  . metoprolol tartrate (LOPRESSOR) 50 MG tablet Take 0.5 tablets (25 mg total) by mouth 2 (two) times daily. 180 tablet 3  . rivaroxaban (XARELTO) 20 MG TABS tablet Take 1 tablet (20 mg total) by mouth daily with supper. 90 tablet 3   No current facility-administered medications for this visit.    No Known Allergies  Health Maintenance Health Maintenance  Topic Date Due  . TETANUS/TDAP  05/22/2017  . INFLUENZA VACCINE  12/20/2017  . HIV Screening  Completed     Exam:  BP 121/81 (BP Location: Left Arm, Patient Position: Sitting, Cuff Size: Large)   Pulse (!) 50   Temp 98.3 F (36.8 C)   Resp 18   Wt 180 lb (81.6 kg)   SpO2 96%   BMI 31.89 kg/m  Gen: Well NAD HEENT: EOMI,  MMM inflamed nasal turbinates and inflamed conjunctiva bilaterally with clear watery discharge.  Mild right-sided cervical lymphadenopathy present. Lungs: Normal work of breathing.  Coarse breath sounds with prolonged expiratory phase right worse than left. Heart: RRR no MRG Abd: NABS, Soft. Nondistended, Nontender Exts: Brisk capillary refill, warm and well perfused.  Psych alert and oriented normal speech thought process and affect.  No SI or HI expressed.  No results found for this or any previous visit (from the past 72  hour(s)). No results found.    Assessment and Plan: 47 y.o. male with  Asthma: Current worsening.  Plan to continue albuterol as needed.  Add Symbicort and Zyrtec for asthma allergy symptoms.  Additionally add Zaditor eyedrops.  Recheck if not better soon.  A. fib: Continue current regimen follow-up with cardiology sometime in July 2019.  Gout: Check basic fasting labs listed below including uric acid.  Depression doing well continue watchful waiting.  Tdap Given today prior to discharge.   Orders Placed This Encounter  Procedures  . Tdap vaccine greater than or equal to 7yo IM  . CBC  . COMPLETE METABOLIC PANEL WITH GFR  . Lipid Panel w/reflex Direct LDL  . Uric acid  . Hemoglobin A1c   Meds ordered this encounter  Medications  . DISCONTD: budesonide-formoterol (SYMBICORT) 160-4.5 MCG/ACT inhaler    Sig: Inhale 2 puffs into the lungs 2 (two) times daily.    Dispense:  1 Inhaler    Refill:  4  . diltiazem (CARTIA XT) 180 MG 24 hr capsule    Sig: Take 1 capsule (180 mg total) by mouth daily.    Dispense:  90 capsule    Refill:  2  . rivaroxaban (XARELTO) 20 MG TABS tablet    Sig: Take 1 tablet (20 mg total) by mouth daily with supper.    Dispense:  90 tablet    Refill:  3  . budesonide-formoterol (SYMBICORT) 160-4.5 MCG/ACT inhaler    Sig: Inhale 2 puffs into the lungs 2 (two) times daily.    Dispense:  1 Inhaler    Refill:  4     Discussed warning signs or symptoms. Please see discharge instructions. Patient expresses understanding.

## 2017-09-10 NOTE — Patient Instructions (Addendum)
Thank you for coming in today. Continue albuterol as needed.  Restart the symbicort inhailler twice daily for 1-2 months.  Take over the counter zyrtec (certizine). Use over-the-counter Zaditor eyedrops (Ketotifen) twice daily as needed for itching.  You can even use flonase nasal spray as needed.  Continue other medicines.   Get labs in morning before you eat breakfast. Asthma, Adult Asthma is a recurring condition in which the airways tighten and narrow. Asthma can make it difficult to breathe. It can cause coughing, wheezing, and shortness of breath. Asthma episodes, also called asthma attacks, range from minor to life-threatening. Asthma cannot be cured, but medicines and lifestyle changes can help control it. What are the causes? Asthma is believed to be caused by inherited (genetic) and environmental factors, but its exact cause is unknown. Asthma may be triggered by allergens, lung infections, or irritants in the air. Asthma triggers are different for each person. Common triggers include:  Animal dander.  Dust mites.  Cockroaches.  Pollen from trees or grass.  Mold.  Smoke.  Air pollutants such as dust, household cleaners, hair sprays, aerosol sprays, paint fumes, strong chemicals, or strong odors.  Cold air, weather changes, and winds (which increase molds and pollens in the air).  Strong emotional expressions such as crying or laughing hard.  Stress.  Certain medicines (such as aspirin) or types of drugs (such as beta-blockers).  Sulfites in foods and drinks. Foods and drinks that may contain sulfites include dried fruit, potato chips, and sparkling grape juice.  Infections or inflammatory conditions such as the flu, a cold, or an inflammation of the nasal membranes (rhinitis).  Gastroesophageal reflux disease (GERD).  Exercise or strenuous activity.  What are the signs or symptoms? Symptoms may occur immediately after asthma is triggered or many hours later.  Symptoms include:  Wheezing.  Excessive nighttime or early morning coughing.  Frequent or severe coughing with a common cold.  Chest tightness.  Shortness of breath.  How is this diagnosed? The diagnosis of asthma is made by a review of your medical history and a physical exam. Tests may also be performed. These may include:  Lung function studies. These tests show how much air you breathe in and out.  Allergy tests.  Imaging tests such as X-rays.  How is this treated? Asthma cannot be cured, but it can usually be controlled. Treatment involves identifying and avoiding your asthma triggers. It also involves medicines. There are 2 classes of medicine used for asthma treatment:  Controller medicines. These prevent asthma symptoms from occurring. They are usually taken every day.  Reliever or rescue medicines. These quickly relieve asthma symptoms. They are used as needed and provide short-term relief.  Your health care provider will help you create an asthma action plan. An asthma action plan is a written plan for managing and treating your asthma attacks. It includes a list of your asthma triggers and how they may be avoided. It also includes information on when medicines should be taken and when their dosage should be changed. An action plan may also involve the use of a device called a peak flow meter. A peak flow meter measures how well the lungs are working. It helps you monitor your condition. Follow these instructions at home:  Take medicines only as directed by your health care provider. Speak with your health care provider if you have questions about how or when to take the medicines.  Use a peak flow meter as directed by your health care provider.  Record and keep track of readings.  Understand and use the action plan to help minimize or stop an asthma attack without needing to seek medical care.  Control your home environment in the following ways to help prevent asthma  attacks: ? Do not smoke. Avoid being exposed to secondhand smoke. ? Change your heating and air conditioning filter regularly. ? Limit your use of fireplaces and wood stoves. ? Get rid of pests (such as roaches and mice) and their droppings. ? Throw away plants if you see mold on them. ? Clean your floors and dust regularly. Use unscented cleaning products. ? Try to have someone else vacuum for you regularly. Stay out of rooms while they are being vacuumed and for a short while afterward. If you vacuum, use a dust mask from a hardware store, a double-layered or microfilter vacuum cleaner bag, or a vacuum cleaner with a HEPA filter. ? Replace carpet with wood, tile, or vinyl flooring. Carpet can trap dander and dust. ? Use allergy-proof pillows, mattress covers, and box spring covers. ? Wash bed sheets and blankets every week in hot water and dry them in a dryer. ? Use blankets that are made of polyester or cotton. ? Clean bathrooms and kitchens with bleach. If possible, have someone repaint the walls in these rooms with mold-resistant paint. Keep out of the rooms that are being cleaned and painted. ? Wash hands frequently. Contact a health care provider if:  You have wheezing, shortness of breath, or a cough even if taking medicine to prevent attacks.  The colored mucus you cough up (sputum) is thicker than usual.  Your sputum changes from clear or white to yellow, green, gray, or bloody.  You have any problems that may be related to the medicines you are taking (such as a rash, itching, swelling, or trouble breathing).  You are using a reliever medicine more than 2-3 times per week.  Your peak flow is still at 50-79% of your personal best after following your action plan for 1 hour.  You have a fever. Get help right away if:  You seem to be getting worse and are unresponsive to treatment during an asthma attack.  You are short of breath even at rest.  You get short of breath when  doing very little physical activity.  You have difficulty eating, drinking, or talking due to asthma symptoms.  You develop chest pain.  You develop a fast heartbeat.  You have a bluish color to your lips or fingernails.  You are light-headed, dizzy, or faint.  Your peak flow is less than 50% of your personal best. This information is not intended to replace advice given to you by your health care provider. Make sure you discuss any questions you have with your health care provider. Document Released: 05/08/2005 Document Revised: 10/20/2015 Document Reviewed: 12/05/2012 Elsevier Interactive Patient Education  2017 [employer]   Medicine and water is ok.   Recheck with me in 6 months or sooner if needed.   Follow up with Dr Jens Somrenshaw in July.

## 2017-09-14 LAB — CBC
HEMATOCRIT: 43.1 % (ref 38.5–50.0)
Hemoglobin: 14.3 g/dL (ref 13.2–17.1)
MCH: 26.2 pg — ABNORMAL LOW (ref 27.0–33.0)
MCHC: 33.2 g/dL (ref 32.0–36.0)
MCV: 78.9 fL — AB (ref 80.0–100.0)
MPV: 10.3 fL (ref 7.5–12.5)
PLATELETS: 240 10*3/uL (ref 140–400)
RBC: 5.46 10*6/uL (ref 4.20–5.80)
RDW: 12.6 % (ref 11.0–15.0)
WBC: 7 10*3/uL (ref 3.8–10.8)

## 2017-09-14 LAB — HEMOGLOBIN A1C
Hgb A1c MFr Bld: 5.8 % of total Hgb — ABNORMAL HIGH (ref <5.7)
MEAN PLASMA GLUCOSE: 120 (calc)
eAG (mmol/L): 6.6 (calc)

## 2017-09-15 LAB — COMPLETE METABOLIC PANEL WITH GFR
AG Ratio: 1.4 (calc) (ref 1.0–2.5)
ALBUMIN MSPROF: 4 g/dL (ref 3.6–5.1)
ALKALINE PHOSPHATASE (APISO): 91 U/L (ref 40–115)
ALT: 29 U/L (ref 9–46)
AST: 23 U/L (ref 10–40)
BUN: 20 mg/dL (ref 7–25)
CO2: 26 mmol/L (ref 20–32)
CREATININE: 1.18 mg/dL (ref 0.60–1.35)
Calcium: 9.2 mg/dL (ref 8.6–10.3)
Chloride: 108 mmol/L (ref 98–110)
GFR, EST NON AFRICAN AMERICAN: 75 mL/min/{1.73_m2} (ref >=60)
GFR, Est African American: 86 mL/min/{1.73_m2} (ref >=60)
GLOBULIN: 2.9 g/dL (ref 1.9–3.7)
GLUCOSE: 87 mg/dL (ref 65–99)
Potassium: 4.1 mmol/L (ref 3.5–5.3)
SODIUM: 143 mmol/L (ref 135–146)
Total Bilirubin: 0.4 mg/dL (ref 0.2–1.2)
Total Protein: 6.9 g/dL (ref 6.1–8.1)

## 2017-09-15 LAB — LIPID PANEL W/REFLEX DIRECT LDL
CHOL/HDL RATIO: 2.9 (calc) (ref <5.0)
CHOLESTEROL: 137 mg/dL (ref <200)
HDL: 48 mg/dL (ref >40)
LDL CHOLESTEROL (CALC): 65 mg/dL
NON-HDL CHOLESTEROL (CALC): 89 mg/dL (ref <130)
Triglycerides: 160 mg/dL — ABNORMAL HIGH (ref <150)

## 2017-09-15 LAB — URIC ACID: Uric Acid, Serum: 8.9 mg/dL — ABNORMAL HIGH (ref 4.0–8.0)

## 2017-09-18 MED ORDER — ALLOPURINOL 300 MG PO TABS: 300.0000 mg | ORAL_TABLET | Freq: Every day | ORAL | 1 refills | Status: DC

## 2017-09-18 NOTE — Addendum Note (Signed)
Addended by: Rodolph Bong on: 09/18/2017 07:00 AM   Modules accepted: Orders

## 2017-10-05 ENCOUNTER — Other Ambulatory Visit: Payer: Self-pay | Admitting: Family Medicine

## 2017-11-03 ENCOUNTER — Other Ambulatory Visit: Payer: Self-pay | Admitting: Family Medicine

## 2018-04-14 ENCOUNTER — Other Ambulatory Visit: Payer: Self-pay | Admitting: Cardiology

## 2018-06-11 ENCOUNTER — Other Ambulatory Visit: Payer: Self-pay | Admitting: Cardiology

## 2018-06-13 ENCOUNTER — Other Ambulatory Visit: Payer: Self-pay | Admitting: Family Medicine

## 2018-06-13 DIAGNOSIS — M1A9XX0 Chronic gout, unspecified, without tophus (tophi): Secondary | ICD-10-CM

## 2018-07-17 ENCOUNTER — Other Ambulatory Visit: Payer: Self-pay | Admitting: Cardiology

## 2018-07-18 NOTE — Telephone Encounter (Signed)
Rx(s) sent to pharmacy electronically.  

## 2018-08-23 ENCOUNTER — Other Ambulatory Visit: Payer: Self-pay | Admitting: Family Medicine

## 2018-08-29 ENCOUNTER — Other Ambulatory Visit: Payer: Self-pay

## 2018-08-29 ENCOUNTER — Ambulatory Visit (INDEPENDENT_AMBULATORY_CARE_PROVIDER_SITE_OTHER): Payer: BLUE CROSS/BLUE SHIELD | Admitting: Family Medicine

## 2018-08-29 DIAGNOSIS — J4530 Mild persistent asthma, uncomplicated: Secondary | ICD-10-CM | POA: Diagnosis not present

## 2018-08-29 DIAGNOSIS — I480 Paroxysmal atrial fibrillation: Secondary | ICD-10-CM

## 2018-08-29 DIAGNOSIS — J302 Other seasonal allergic rhinitis: Secondary | ICD-10-CM | POA: Diagnosis not present

## 2018-08-29 DIAGNOSIS — I10 Essential (primary) hypertension: Secondary | ICD-10-CM | POA: Diagnosis not present

## 2018-08-29 MED ORDER — ALBUTEROL SULFATE HFA 108 (90 BASE) MCG/ACT IN AERS: INHALATION_SPRAY | RESPIRATORY_TRACT | 0 refills | Status: DC

## 2018-08-29 MED ORDER — METOPROLOL TARTRATE 50 MG PO TABS: 25.0000 mg | ORAL_TABLET | Freq: Two times a day (BID) | ORAL | 0 refills | Status: AC

## 2018-08-29 MED ORDER — BUDESONIDE-FORMOTEROL FUMARATE 160-4.5 MCG/ACT IN AERO: 2.0000 | INHALATION_SPRAY | Freq: Two times a day (BID) | RESPIRATORY_TRACT | 4 refills | Status: AC

## 2018-08-29 NOTE — Progress Notes (Addendum)
Virtual Visit  via Phone Note  I connected with      [name]  by a telemedicine application and verified that I am speaking with the correct person using two identifiers.   I discussed the limitations of evaluation and management by telemedicine and the availability of in person appointments. The patient expressed understanding and agreed to proceed.  History of Present Illness: Billy Beard is a 48 y.o. male who would like to discuss nasal congestion.   Billy Beard has a history of seasonal allergies and asthma.  He notes over the past 2 to 3 days he has been having worsening runny nose congestion and sinus pain and pressure.  He also notes he has been having wheezing and slight shortness of breath.  His symptoms are consistent with previous episodes of asthma and seasonal allergies.  He denies any fevers or chills body aches nausea vomiting diarrhea or cough.  He notes he has an existing prescription for albuterol and when he uses his albuterol inhaler does help his symptoms.  He notes he is about to run out of the albuterol.  Additionally he has a prescription for Symbicort and he notes that that is been helpful as well although he is run out of it.  Additionally he has paroxysmal atrial fibrillation and hypertension and has been taking metoprolol and Cartia XT.  He is about to run out of his metoprolol as well as he needs a follow-up visit with his cardiologist.  Observations/Objective:   Wt Readings from Last 5 Encounters:  09/10/17 180 lb (81.6 kg)  01/24/17 181 lb 1.6 oz (82.1 kg)  01/16/17 181 lb (82.1 kg)  12/06/16 183 lb (83 kg)  10/03/16 186 lb 4.8 oz (84.5 kg)   Exam: Normal Speech.  No shortness of breath or wheezing.  No hoarseness of voice. Psych: Alert and oriented normal speech thought process and affect verbally  Lab and Radiology Results No results found for this or any previous visit (from the past 72 hour(s)). No results found.   Assessment and  Plan: 48 y.o. male with exacerbation of seasonal allergies complicating moderately controlled asthma.  Patient has not yet reached significant asthma exacerbation. Plan to control allergies with antihistamine such as Zyrtec or Claritin or Allegra.  Additionally will refill his albuterol for use as needed as a rescue inhaler.  Most importantly will refill Symbicort for use to control his underlying asthma.  Additionally for his hypertension and atrial fibrillation will continue the diltiazem but also refill metoprolol.  We will have patient schedule a video recheck visit in a few weeks to reassess his baseline medical problems that were put on the back burner today due to his current illness.  He will need reassessment for his other medical problems including obesity prediabetes as well as status of his Xarelto for anticoagulation.  Additionally will encourage patient to sign up for my chart so we can have more easy communication.  PDMP not reviewed this encounter. No orders of the defined types were placed in this encounter.  Meds ordered this encounter  Medications  . budesonide-formoterol (SYMBICORT) 160-4.5 MCG/ACT inhaler    Sig: Inhale 2 puffs into the lungs 2 (two) times daily.    Dispense:  1 Inhaler    Refill:  4  . albuterol (PROAIR HFA) 108 (90 Base) MCG/ACT inhaler    Sig: TAKE 2 PUFFS BY MOUTH EVERY 6 HOURS AS NEEDED FOR WHEEZE OR SHORTNESS OF BREATH    Dispense:  8.5 Inhaler  Refill:  0  . metoprolol tartrate (LOPRESSOR) 50 MG tablet    Sig: Take 0.5 tablets (25 mg total) by mouth 2 (two) times daily. NEEDS APPOINTMENT FOR FUTURE REFILLS    Dispense:  30 tablet    Refill:  0    Follow Up Instructions:    I discussed the assessment and treatment plan with the patient. The patient was provided an opportunity to ask questions and all were answered. The patient agreed with the plan and demonstrated an understanding of the instructions.   The patient was advised to call back  or seek an in-person evaluation if the symptoms worsen or if the condition fails to improve as anticipated.  I provided 25 minutes of non-face-to-face time during this encounter.    Historical information moved to improve visibility of documentation.  Past Medical History:  Diagnosis Date  . Asthma   . Atrial fibrillation (HCC)   . Gout   . Hypertension    Past Surgical History:  Procedure Laterality Date  . LEFT HEART CATH AND CORONARY ANGIOGRAPHY N/A 09/08/2016   Procedure: Left Heart Cath and Coronary Angiography;  Surgeon: Lyn RecordsHenry W Smith, MD;  Location: [facility];  Service: Cardiovascular;  Laterality: N/A;  . THYROIDECTOMY  08/2015   Patient unsure how much was removed    Social History   Tobacco Use  . Smoking status: Former Smoker    Packs/day: 0.25    Years: 3.00    Pack years: 0.75    Types: Cigarettes  . Smokeless tobacco: Current User    Types: Chew  Substance Use Topics  . Alcohol use: Yes    Comment: Occasionally per patient, 550cc per patient a week   family history includes Gout in his father; Heart disease in his mother.  Medications: Current Outpatient Medications  Medication Sig Dispense Refill  . albuterol (PROAIR HFA) 108 (90 Base) MCG/ACT inhaler TAKE 2 PUFFS BY MOUTH EVERY 6 HOURS AS NEEDED FOR WHEEZE OR SHORTNESS OF BREATH 8.5 Inhaler 0  . allopurinol (ZYLOPRIM) 300 MG tablet TAKE 1 TABLET BY MOUTH ONCE DAILY 90 tablet 0  . atorvastatin (LIPITOR) 40 MG tablet Take 1 tablet (40 mg total) by mouth daily. 90 tablet 3  . budesonide-formoterol (SYMBICORT) 160-4.5 MCG/ACT inhaler Inhale 2 puffs into the lungs 2 (two) times daily. 1 Inhaler 4  . CARTIA XT 180 MG 24 hr capsule Take 1 capsule by mouth once daily 90 capsule 0  . metoprolol tartrate (LOPRESSOR) 50 MG tablet Take 0.5 tablets (25 mg total) by mouth 2 (two) times daily. NEEDS APPOINTMENT FOR FUTURE REFILLS 30 tablet 0  . rivaroxaban (XARELTO) 20 MG TABS tablet Take 1 tablet (20 mg total)  by mouth daily with supper. 90 tablet 3   No current facility-administered medications for this visit.    No Known Allergies  Addendum to correct incorrect date due to templating error

## 2018-08-29 NOTE — Patient Instructions (Addendum)
Thank you for coming in today.  Use medicines like Claritin Allegra Zyrtec daily for seasonal allergies. Also consider Flonase nasal spray over-the-counter. Use albuterol as needed. Continue taking Symbicort 2 puffs twice daily.  Recheck with me via video visit for 3 weeks.  Blood pressure and heart rate and weight have those vital signs ready to go over the follow-up visit.

## 2018-09-02 ENCOUNTER — Telehealth: Payer: Self-pay | Admitting: Family Medicine

## 2018-09-02 MED ORDER — ALBUTEROL SULFATE HFA 108 (90 BASE) MCG/ACT IN AERS: INHALATION_SPRAY | RESPIRATORY_TRACT | 0 refills | Status: AC

## 2018-09-02 NOTE — Telephone Encounter (Signed)
I sent in an albuterol inhaler on April 9.  Please inquire if patient picked it up and if he is use the entire inhaler in 4 days?  New albuterol inhaler sent to pharmacy.  Additionally I sent in Symbicort on the ninth as well which is an inhaler.  Did he pick that up?

## 2018-09-02 NOTE — Telephone Encounter (Signed)
Left message with information below and for patient to call us back to schedule an appointment or webex. °

## 2018-09-02 NOTE — Telephone Encounter (Signed)
-----   Message from Rodolph Bong, MD sent at 08/29/2018  3:31 PM EDT ----- Regarding: 3 week follow up Please schedule 3 week follow-up for hypertension, asthma, and hyperlipidemia.  Schedule via WebEx

## 2018-09-02 NOTE — Telephone Encounter (Signed)
Patient called requested a Rx for an inhaler due to his breathing . Please advise. Jannet Calip,CMA

## 2018-09-03 NOTE — Telephone Encounter (Signed)
Called patient to advise but kept getting a busy signal. Billy Beard

## 2018-10-08 ENCOUNTER — Telehealth: Payer: Self-pay

## 2018-10-08 NOTE — Telephone Encounter (Signed)
Cassandra, case Production designer, theatre/television/film from [facility], called to advise Dr Denyse Amass that pt has agreed to be a part of case management program. Per Elonda Husky, program just entails patient education and teaching every 2-3 months.   No needs at this time. Sending as Lorain Childes to PCP

## 2019-01-10 IMAGING — CT CT HEAD W/O CM
3 of 4 series · 15 of 47 positions shown, 18 images · non-contrast
Comparison: Orbit CT, 09/27/2011

CLINICAL DATA: Pt from home with c/o suicidal thoughts with a plan
to hang himself. Pt states he has been having arguments with his
wife that end with his wife telling him to hang himself. Pt states
he went to [place] yesterday to see his son one last time and was
going to hang himself today. Pt states instead he went to his PCP
who told him to come here. Pt states he has been evaluated for his
htn and headache and has been put on medications for his htn. Pt is
tearful at time of assessment

EXAM:
CT HEAD WITHOUT CONTRAST
TECHNIQUE: Contiguous axial images were obtained from the base of the skull
through the vertex without intravenous contrast.

[Series 2: head w/o · axial · non-contrast · 0.40mm/px · z∈[-103,+17]mm · 9 of 30 slices shown, 12 images]
[im 3/30  brain]
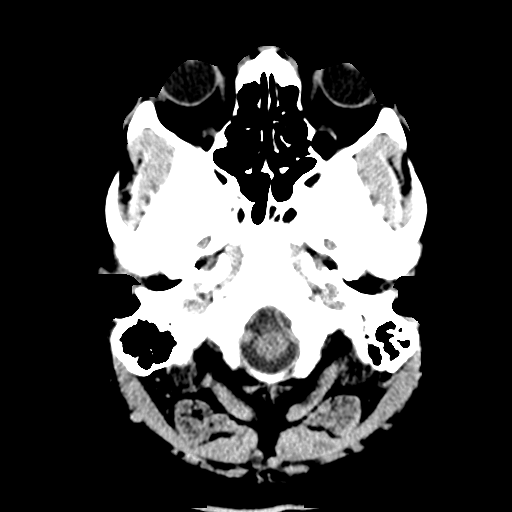
[im 3/30  bone]
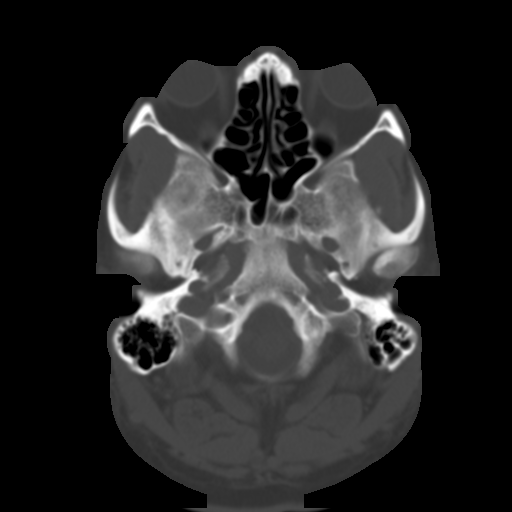
[im 7/30  brain]
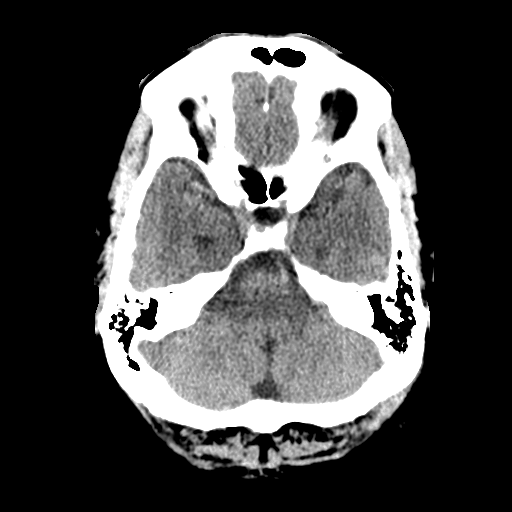
[im 9/30  brain]
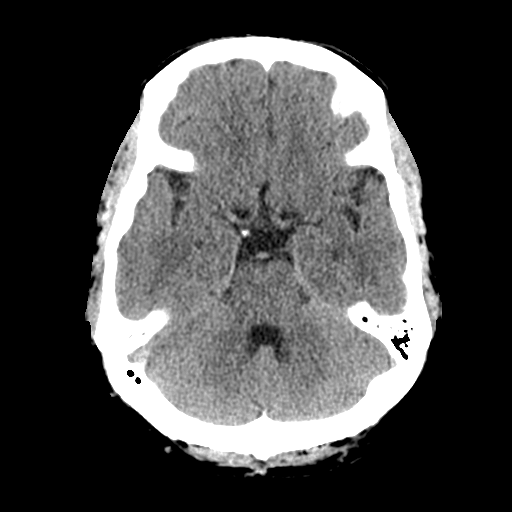
[im 13/30  brain]
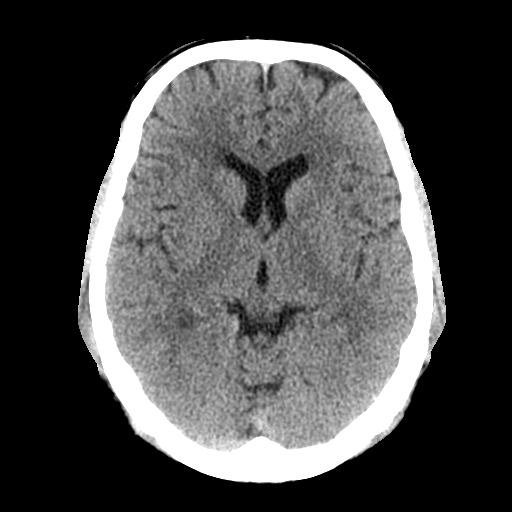
[im 15/30  brain]
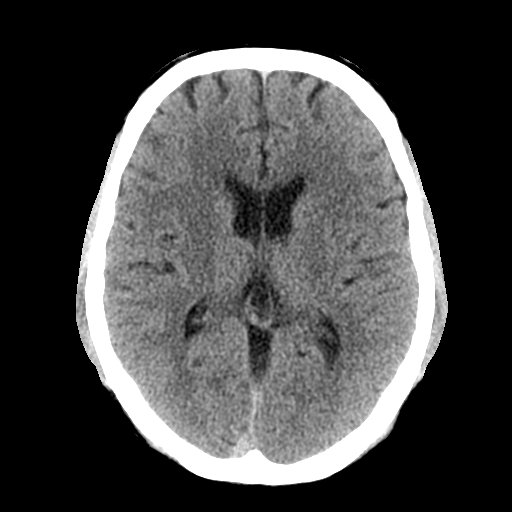
[im 15/30  bone]
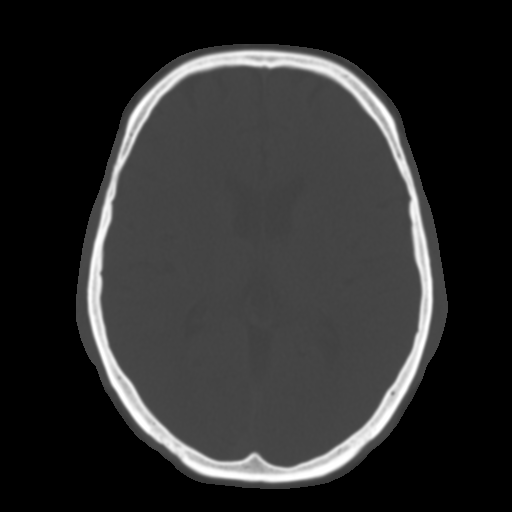
[im 17/30  brain]
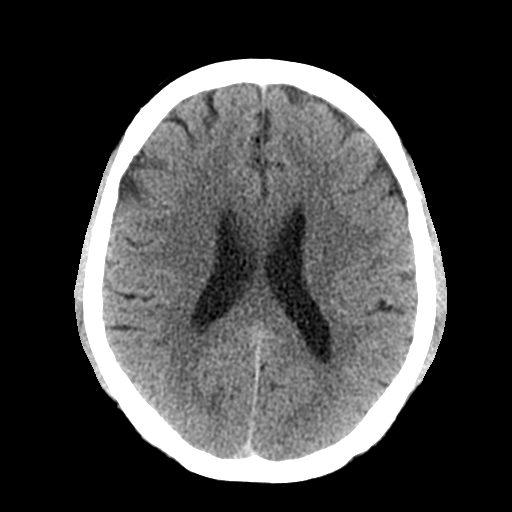
[im 21/30  brain]
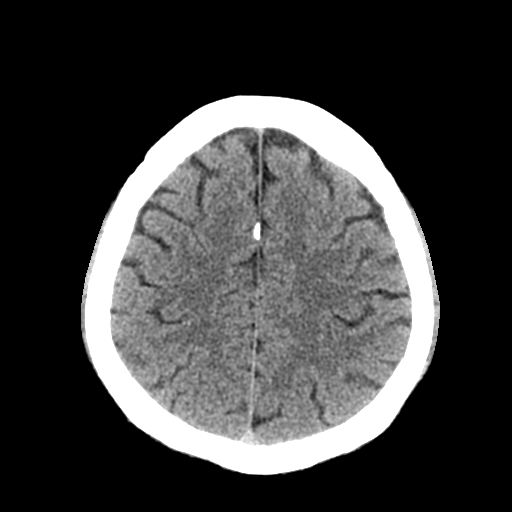
[im 23/30  brain]
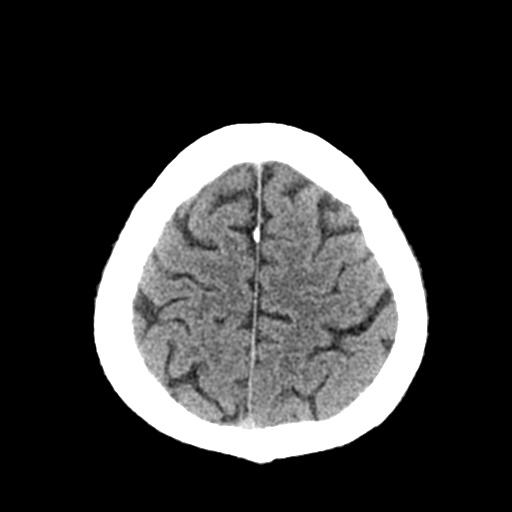
[im 27/30  brain]
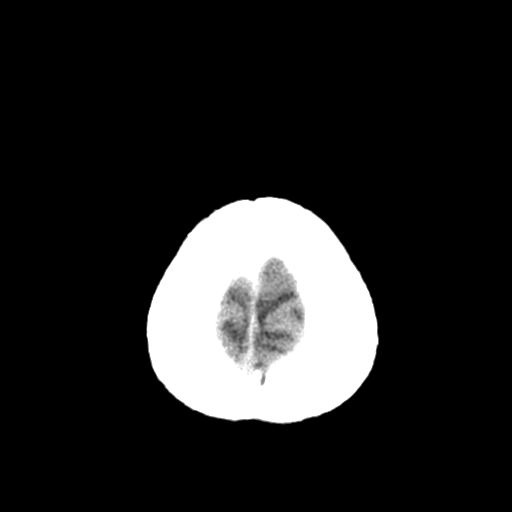
[im 27/30  bone]
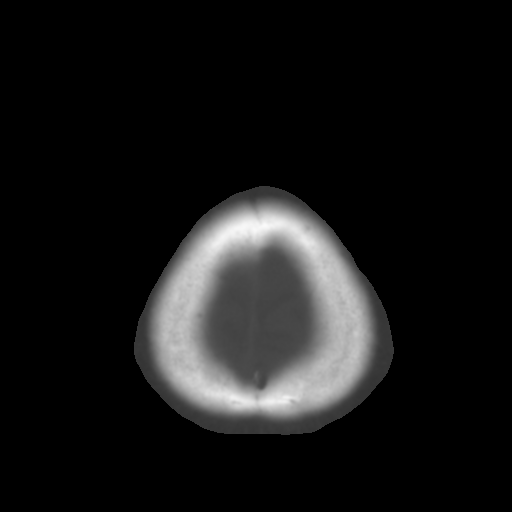

[Series 4: coronal · coronal · 0.29mm/px · 3 of 61 slices shown]
[im 21/61  brain]
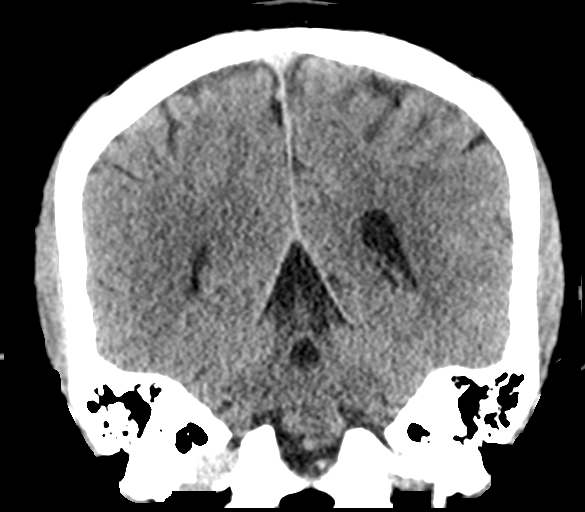
[im 27/61  brain]
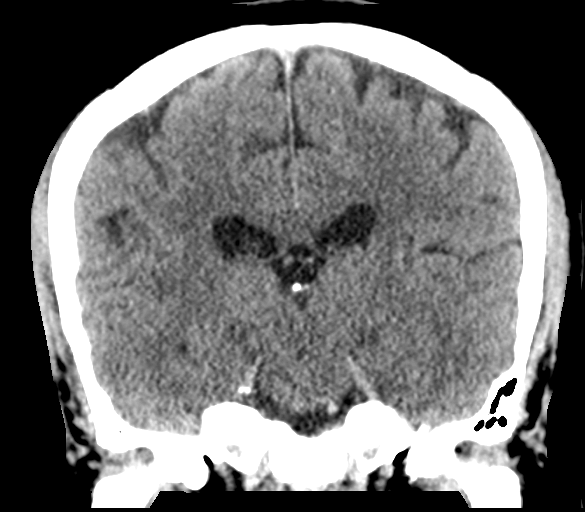
[im 34/61  brain]
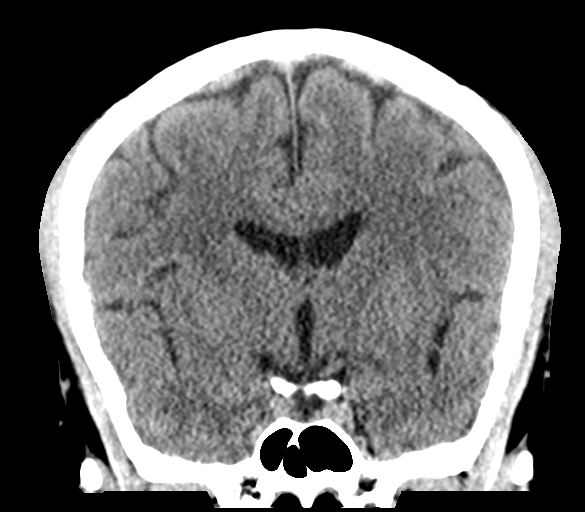

[Series 5: sagittal · sagittal · 0.29mm/px · 3 of 51 slices shown]
[im 17/51  brain]
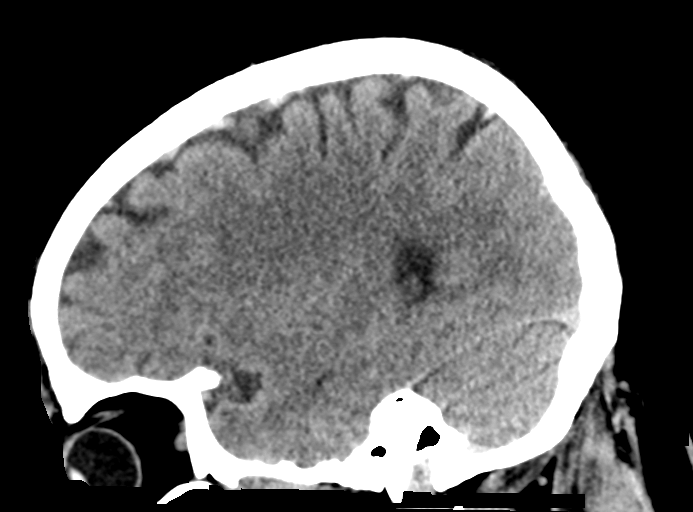
[im 26/51  brain]
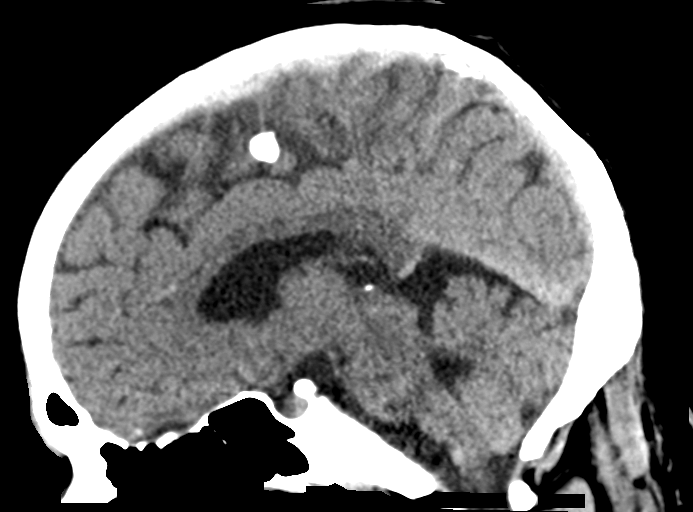
[im 34/51  brain]
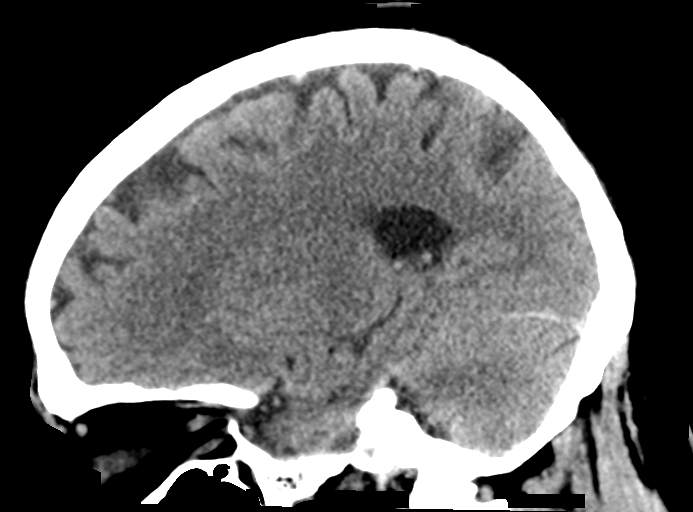

[15 of 47 positions shown; findings below may reference images not displayed]

FINDINGS: Brain: No evidence of acute infarction, hemorrhage, hydrocephalus,
extra-axial collection or mass lesion/mass effect.

Vascular: No hyperdense vessel or unexpected calcification.

Skull: Normal. Negative for fracture or focal lesion.

Sinuses/Orbits: Visualize globes and orbits are unremarkable.
Visualized sinuses and mastoid air cells are clear.

Other: None.
IMPRESSION: Normal unenhanced CT scan of the brain.

## 2019-10-28 ENCOUNTER — Other Ambulatory Visit: Payer: Self-pay | Admitting: Family Medicine

## 2019-10-29 NOTE — Telephone Encounter (Signed)
Please call patient, appt needed for refills

## 2019-10-29 NOTE — Telephone Encounter (Signed)
Called patient and he states he is switching care to someone else.

## 2019-10-30 NOTE — Telephone Encounter (Signed)
See message from ben below.
